# Patient Record
Sex: Male | Born: 1950 | Race: White | Hispanic: No | Marital: Married | Smoking: Never smoker
Health system: Southern US, Community
[De-identification: ages and names within clinical notes are randomized; demographics above are authoritative.]

## PROBLEM LIST (undated history)

## (undated) DIAGNOSIS — N200 Calculus of kidney: Secondary | ICD-10-CM

## (undated) DIAGNOSIS — I10 Essential (primary) hypertension: Secondary | ICD-10-CM

## (undated) DIAGNOSIS — E119 Type 2 diabetes mellitus without complications: Secondary | ICD-10-CM

## (undated) HISTORY — PX: MANDIBLE SURGERY: SHX707

## (undated) HISTORY — PX: SHOULDER SURGERY: SHX246

## (undated) HISTORY — DX: Calculus of kidney: N20.0

## (undated) HISTORY — PX: TOE AMPUTATION: SHX809

---

## 2013-01-07 ENCOUNTER — Emergency Department (HOSPITAL_COMMUNITY)
Admission: EM | Admit: 2013-01-07 | Discharge: 2013-01-07 | Disposition: A | Discharge status: Home / Self Care | Payer: No Typology Code available for payment source | Attending: Emergency Medicine | Admitting: Emergency Medicine

## 2013-01-07 ENCOUNTER — Encounter (HOSPITAL_COMMUNITY): Payer: Self-pay | Admitting: Emergency Medicine

## 2013-01-07 DIAGNOSIS — Z79899 Other long term (current) drug therapy: Secondary | ICD-10-CM | POA: Insufficient documentation

## 2013-01-07 DIAGNOSIS — R197 Diarrhea, unspecified: Secondary | ICD-10-CM | POA: Insufficient documentation

## 2013-01-07 DIAGNOSIS — A059 Bacterial foodborne intoxication, unspecified: Secondary | ICD-10-CM | POA: Insufficient documentation

## 2013-01-07 DIAGNOSIS — I10 Essential (primary) hypertension: Secondary | ICD-10-CM | POA: Insufficient documentation

## 2013-01-07 DIAGNOSIS — E119 Type 2 diabetes mellitus without complications: Secondary | ICD-10-CM | POA: Insufficient documentation

## 2013-01-07 HISTORY — DX: Essential (primary) hypertension: I10

## 2013-01-07 HISTORY — DX: Type 2 diabetes mellitus without complications: E11.9

## 2013-01-07 MED ORDER — ONDANSETRON HCL 4 MG/2ML IJ SOLN
4.0000 mg | Freq: Once | INTRAMUSCULAR | Status: AC
  Administered 2013-01-07: 4 mg via INTRAVENOUS
  Filled 2013-01-07: qty 2

## 2013-01-07 MED ORDER — SODIUM CHLORIDE 0.9 % IV BOLUS (SEPSIS)
1000.0000 mL | Freq: Once | INTRAVENOUS | Status: AC
  Administered 2013-01-07: 1000 mL via INTRAVENOUS

## 2013-01-07 MED ORDER — KETOROLAC TROMETHAMINE 30 MG/ML IJ SOLN
30.0000 mg | Freq: Once | INTRAMUSCULAR | Status: AC
  Administered 2013-01-07: 30 mg via INTRAVENOUS
  Filled 2013-01-07: qty 1

## 2013-01-07 NOTE — ED Provider Notes (Signed)
CSN: [id number]     Arrival date & time 01/07/13  1849 History   First MD Initiated Contact with Patient 01/07/13 1915     Chief Complaint  Patient presents with  . Emesis   (Consider location/radiation/quality/duration/timing/severity/associated sxs/prior Treatment) Patient is a 62 y.o. male presenting with vomiting. The history is provided by the patient. No language interpreter was used.  Emesis Duration:  5 hours Timing:  Intermittent How soon after eating does vomiting occur:  2 hours Associated symptoms: diarrhea   Associated symptoms: no abdominal pain   Risk factors: suspect food intake     Past Medical History  Diagnosis Date  . Diabetes mellitus without complication   . Hypertension    No past surgical history on file. No family history on file. History  Substance Use Topics  . Smoking status: Not on file  . Smokeless tobacco: Not on file  . Alcohol Use: Not on file    Review of Systems  Constitutional: Negative for activity change.  Gastrointestinal: Positive for nausea, vomiting and diarrhea. Negative for abdominal pain.  Pt is a 62 year old male who presents this evening with vomiting and nausea. He reports that he ate raw oysters on the half shell and steamed shrimp for lunch today at a [employer]. He reports that he had projectile vomiting 2 1/2 hours after eating and then diarrhea followed. He reports at least three episodes of diarrhea and vomiting episodes too numerous to count. No visible hemataemesis or bloody stools. He denies abdominal pain and reports that he just has an upset stomach. He also reports that he has gradually gotten a little better and that the vomiting is slowing down.   Allergies  Review of patient's allergies indicates no known allergies.  Home Medications   Current Outpatient Rx  Name  Route  Sig  Dispense  Refill  . aspirin-acetaminophen-caffeine (EXCEDRIN MIGRAINE) 250-250-65 MG per tablet   Oral   Take 1 tablet by  mouth every 6 (six) hours as needed for pain.         Marland Kitchen lisinopril (PRINIVIL,ZESTRIL) 20 MG tablet   Oral   Take 20 mg by mouth daily.         . metFORMIN (GLUCOPHAGE) 1000 MG tablet   Oral   Take 1,000 mg by mouth 2 (two) times daily with a meal.         . Multiple Vitamin (MULTIVITAMIN WITH MINERALS) TABS tablet   Oral   Take 1 tablet by mouth daily.          BP 104/68  Pulse 96  Temp(Src) 97.4 F (36.3 C) (Oral)  Resp 20  SpO2 95% Physical Exam  Nursing note and vitals reviewed. Constitutional: He is oriented to person, place, and time. He appears well-developed and well-nourished.  HENT:  Head: Atraumatic.  Mouth/Throat: Oropharynx is clear and moist.  Neck: Normal range of motion. Neck supple.  Cardiovascular: Normal rate, regular rhythm and intact distal pulses.  Exam reveals no gallop and no friction rub.   No murmur heard. Pulmonary/Chest: Effort normal and breath sounds normal.  Abdominal: Soft. Bowel sounds are normal. He exhibits no distension. There is no tenderness.  Musculoskeletal: Normal range of motion.  Neurological: He is alert and oriented to person, place, and time.  Skin: Skin is warm and dry.    ED Course  Procedures (including critical care time) Labs Review Labs Reviewed - No data to display Imaging Review No results found.  MDM   1.  Food poisoning, unspecified     PLAN Hydration with IV fluids and Zofran for nausea.     Irish Elders, NP 01/07/13 1957  Irish Elders, NP 01/07/13 2013

## 2013-01-07 NOTE — ED Notes (Signed)
Patient ate raw oysters today at lunch, started at 5pm with vomiting and diarrhea.  Denies abdominal pain.  EMS gave him 4mg  Zofran IV.  Patient is diabetic.  CBG-254 which he says is normal for him.  Denies fever, chills and no symptoms before today.

## 2013-01-07 NOTE — ED Notes (Signed)
Bed: WA09 Expected date:  Expected time:  Means of arrival:  Comments: EMS 

## 2013-01-07 NOTE — Progress Notes (Signed)
Patient confirms that he does not have a pcp.  As per patient's wife they have just moved here.  Patient's wife confirms that they have insurance but they do not know which type it is,  "But it's good."  EDCM instructed patient to call the number on the back of his insurance card or go to insurance company's website to help him find a physician whois close to him and within network.  Patient's wife verbalized understanding.  Patient being discharged home.

## 2013-01-08 LAB — GLUCOSE, CAPILLARY: Glucose-Capillary: 301 mg/dL — ABNORMAL HIGH (ref 70–99)

## 2013-01-12 NOTE — ED Provider Notes (Signed)
Medical screening examination/treatment/procedure(s) were performed by non-physician practitioner and as supervising physician I was immediately available for consultation/collaboration.  Juliet Rude. Rubin Payor, MD 01/12/13 [phone]

## 2013-03-01 ENCOUNTER — Encounter: Payer: Self-pay | Admitting: Family Medicine

## 2013-03-01 ENCOUNTER — Encounter (INDEPENDENT_AMBULATORY_CARE_PROVIDER_SITE_OTHER): Payer: Self-pay

## 2013-03-01 ENCOUNTER — Ambulatory Visit (INDEPENDENT_AMBULATORY_CARE_PROVIDER_SITE_OTHER): Payer: PRIVATE HEALTH INSURANCE | Admitting: Family Medicine

## 2013-03-01 VITALS — BP 117/74 | HR 75 | Temp 98.7°F | Ht 72.0 in | Wt 203.0 lb

## 2013-03-01 DIAGNOSIS — R634 Abnormal weight loss: Secondary | ICD-10-CM

## 2013-03-01 DIAGNOSIS — E119 Type 2 diabetes mellitus without complications: Secondary | ICD-10-CM

## 2013-03-01 DIAGNOSIS — I10 Essential (primary) hypertension: Secondary | ICD-10-CM

## 2013-03-01 DIAGNOSIS — R5381 Other malaise: Secondary | ICD-10-CM

## 2013-03-01 LAB — POCT CBC
Granulocyte percent: 52.9 %G (ref 37–80)
HCT, POC: 44.5 % (ref 43.5–53.7)
Hemoglobin: 15 g/dL (ref 14.1–18.1)
Lymph, poc: 3.4 (ref 0.6–3.4)
MCH, POC: 29.6 pg (ref 27–31.2)
MCHC: 33.6 g/dL (ref 31.8–35.4)
MCV: 88 fL (ref 80–97)
MPV: 6.3 fL (ref 0–99.8)
POC Granulocyte: 4.4 (ref 2–6.9)
POC LYMPH PERCENT: 41.5 %L (ref 10–50)
Platelet Count, POC: 348 10*3/uL (ref 142–424)
RBC: 5.1 M/uL (ref 4.69–6.13)
RDW, POC: 12.4 %
WBC: 8.3 10*3/uL (ref 4.6–10.2)

## 2013-03-01 LAB — POCT GLYCOSYLATED HEMOGLOBIN (HGB A1C): Hemoglobin A1C: 9.5

## 2013-03-01 MED ORDER — LISINOPRIL 20 MG PO TABS: 20.0000 mg | ORAL_TABLET | Freq: Every day | ORAL | Status: DC

## 2013-03-01 MED ORDER — METFORMIN HCL 1000 MG PO TABS: 1000.0000 mg | ORAL_TABLET | Freq: Two times a day (BID) | ORAL | Status: DC

## 2013-03-01 MED ORDER — DAPAGLIFLOZIN PROPANEDIOL 10 MG PO TABS: 10.0000 mg | ORAL_TABLET | ORAL | Status: DC

## 2013-03-01 NOTE — Patient Instructions (Signed)

## 2013-03-01 NOTE — Progress Notes (Signed)
  Subjective:    Patient ID: Joe Marks, male    DOB: [birth year], 62 y.o.   MRN: [id number]  HPI This 62 y.o. male presents for evaluation of diabetes.  He states he has been losing Weight and has polyuria.  He has hx of diabetes.  He is taking metformin 1000mg  po bid. He states he has been having fsbs in the 200's. He has been exercising and watching His diet.  He states he is a weight lifter and has prided himself in being in top notch shape By running and lifting weights.  He states he has lost 30 pounds despite eating and working Out.   Review of Systems C/o weight loss and hyperglycemia No chest pain, SOB, HA, dizziness, vision change, N/V, diarrhea, constipation, dysuria, urinary urgency or frequency, myalgias, arthralgias or rash.     Objective:   Physical Exam Vital signs noted  Well developed well nourished male.  HEENT - Head atraumatic Normocephalic                Eyes - PERRLA, Conjuctiva - clear Sclera- Clear EOMI                Ears - EAC's Wnl TM's Wnl Gross Hearing WNL                Nose - Nares patent                 Throat - oropharanx wnl Respiratory - Lungs CTA bilateral Cardiac - RRR S1 and S2 without murmur GI - Abdomen soft Nontender and bowel sounds active x 4 Extremities - No edema. Neuro - Grossly intact.  Results for orders placed in visit on 03/01/13  POCT CBC      Result Value Range   WBC 8.3  4.6 - 10.2 K/uL   Lymph, poc 3.4  0.6 - 3.4   POC LYMPH PERCENT 41.5  10 - 50 %L   POC Granulocyte 4.4  2 - 6.9   Granulocyte percent 52.9  37 - 80 %G   RBC 5.1  4.69 - 6.13 M/uL   Hemoglobin 15.0  14.1 - 18.1 g/dL   HCT, POC 30.8  65.7 - 53.7 %   MCV 88.0  80 - 97 fL   MCH, POC 29.6  27 - 31.2 pg   MCHC 33.6  31.8 - 35.4 g/dL   RDW, POC 84.6     Platelet Count, POC 348.0  142 - 424 K/uL   MPV 6.3  0 - 99.8 fL  POCT GLYCOSYLATED HEMOGLOBIN (HGB A1C)      Result Value Range   Hemoglobin A1C 9.5%         Assessment & Plan:  Other malaise  and fatigue - Plan: POCT CBC, POCT glycosylated hemoglobin (Hb A1C), CMP14+EGFR, Thyroid Panel With TSH  Diabetes - Plan: metFORMIN (GLUCOPHAGE) 1000 MG tablet, POCT CBC, POCT glycosylated hemoglobin (Hb A1C), CMP14+EGFR, Thyroid Panel With TSH, Dapagliflozin Propanediol (FARXIGA) 10 MG TABS.  Explained that the uncontrolled diabetes is causing him to lose body mass and that better control will help him.  Start farxiga 10mg  po qd #14 samples with a discount card.  Loss of weight - Plan: POCT CBC, POCT glycosylated hemoglobin (Hb A1C), CMP14+EGFR, Thyroid Panel With TSH  Essential hypertension, benign - Plan: lisinopril (PRINIVIL,ZESTRIL) 20 MG tablet  Deatra Canter FNP

## 2013-03-02 LAB — CMP14+EGFR
ALT: 13 IU/L (ref 0–44)
AST: 15 IU/L (ref 0–40)
Albumin/Globulin Ratio: 1.8 (ref 1.1–2.5)
Albumin: 4.2 g/dL (ref 3.6–4.8)
Alkaline Phosphatase: 84 IU/L (ref 39–117)
BUN/Creatinine Ratio: 16 (ref 10–22)
BUN: 13 mg/dL (ref 8–27)
CO2: 27 mmol/L (ref 18–29)
Calcium: 9.9 mg/dL (ref 8.6–10.2)
Chloride: 95 mmol/L — ABNORMAL LOW (ref 97–108)
Creatinine, Ser: 0.82 mg/dL (ref 0.76–1.27)
GFR calc Af Amer: 110 mL/min/{1.73_m2} (ref >59)
GFR calc non Af Amer: 95 mL/min/{1.73_m2} (ref >59)
Globulin, Total: 2.4 g/dL (ref 1.5–4.5)
Glucose: 241 mg/dL — ABNORMAL HIGH (ref 65–99)
Potassium: 4.7 mmol/L (ref 3.5–5.2)
Sodium: 138 mmol/L (ref 134–144)
Total Bilirubin: 1.3 mg/dL — ABNORMAL HIGH (ref 0.0–1.2)
Total Protein: 6.6 g/dL (ref 6.0–8.5)

## 2013-03-02 LAB — THYROID PANEL WITH TSH
Free Thyroxine Index: 2.2 (ref 1.2–4.9)
T3 Uptake Ratio: 30 % (ref 24–39)
T4, Total: 7.2 ug/dL (ref 4.5–12.0)
TSH: 1.05 u[IU]/mL (ref 0.450–4.500)

## 2013-03-11 ENCOUNTER — Other Ambulatory Visit: Payer: Self-pay | Admitting: Family Medicine

## 2013-03-11 ENCOUNTER — Telehealth: Payer: Self-pay | Admitting: *Deleted

## 2013-03-11 MED ORDER — CANAGLIFLOZIN 300 MG PO TABS: 300.0000 mg | ORAL_TABLET | Freq: Every day | ORAL | Status: DC

## 2013-03-11 NOTE — Telephone Encounter (Signed)
Rx for invokana sent to patient pharmacy

## 2013-03-11 NOTE — Telephone Encounter (Signed)
Ins co does not cover farxiga at all will not even let me fill out prior authorization,  However I got them to check invokana, komblize and onglyza and all three are covered without prior authorization,  Hope this is some help.thanks in advance for your attention.

## 2013-03-11 NOTE — Telephone Encounter (Signed)
Message left on pt phone rx had been changed and new rx sent to [facility] pharmacy and number provided

## 2013-03-11 NOTE — Telephone Encounter (Signed)
Sent invokana to pharmacy

## 2013-03-19 ENCOUNTER — Encounter: Payer: Self-pay | Admitting: Family Medicine

## 2013-03-19 ENCOUNTER — Ambulatory Visit (INDEPENDENT_AMBULATORY_CARE_PROVIDER_SITE_OTHER): Payer: PRIVATE HEALTH INSURANCE | Admitting: Family Medicine

## 2013-03-19 VITALS — BP 105/64 | HR 76 | Temp 99.4°F | Ht 73.0 in | Wt 202.0 lb

## 2013-03-19 DIAGNOSIS — E119 Type 2 diabetes mellitus without complications: Secondary | ICD-10-CM

## 2013-03-19 MED ORDER — GLIPIZIDE 10 MG PO TABS: 10.0000 mg | ORAL_TABLET | Freq: Two times a day (BID) | ORAL | Status: DC

## 2013-03-19 NOTE — Patient Instructions (Signed)

## 2013-03-19 NOTE — Progress Notes (Signed)
  Subjective:    Patient ID: Joe Marks, male    DOB: August 07, 1950, 62 y.o.   MRN: [id number]  HPI  This 62 y.o. male presents for evaluation of diabetes.  He is not tolerating metformin And his fsbs's are running in the 300's.  He used to take glucotrol and it worked.  Review of Systems No chest pain, SOB, HA, dizziness, vision change, N/V, diarrhea, constipation, dysuria, urinary urgency or frequency, myalgias, arthralgias or rash.     Objective:   Physical Exam Vital signs noted  Well developed well nourished male.  HEENT - Head atraumatic Normocephalic                Eyes - PERRLA, Conjuctiva - clear Sclera- Clear EOMI                Ears - EAC's Wnl TM's Wnl Gross Hearing WNL                Nose - Nares patent                 Throat - oropharanx wnl Respiratory - Lungs CTA bilateral Cardiac - RRR S1 and S2 without murmur GI - Abdomen soft Nontender and bowel sounds active x 4 Extremities - No edema. Neuro - Grossly intact.       Assessment & Plan:  Diabetes - Plan: glipiZIDE (GLUCOTROL) 10 MG tablet DC metformin, continue farxiga, and add glucotrol and if not better then  Follow up.  Followup in 3 months  Deatra Canter FNP

## 2013-04-12 ENCOUNTER — Other Ambulatory Visit: Payer: Self-pay | Admitting: Family Medicine

## 2013-04-13 MED ORDER — VARDENAFIL HCL 10 MG PO TABS: 10.0000 mg | ORAL_TABLET | Freq: Every day | ORAL | Status: DC | PRN

## 2013-06-02 ENCOUNTER — Ambulatory Visit: Payer: PRIVATE HEALTH INSURANCE | Admitting: Family Medicine

## 2013-06-11 ENCOUNTER — Ambulatory Visit: Payer: PRIVATE HEALTH INSURANCE | Admitting: Family Medicine

## 2013-06-14 ENCOUNTER — Ambulatory Visit: Payer: PRIVATE HEALTH INSURANCE | Admitting: Family Medicine

## 2013-07-02 ENCOUNTER — Ambulatory Visit: Payer: PRIVATE HEALTH INSURANCE | Admitting: Family Medicine

## 2013-07-07 ENCOUNTER — Ambulatory Visit (INDEPENDENT_AMBULATORY_CARE_PROVIDER_SITE_OTHER): Payer: BC Managed Care – PPO

## 2013-07-07 ENCOUNTER — Encounter: Payer: Self-pay | Admitting: Family Medicine

## 2013-07-07 ENCOUNTER — Ambulatory Visit (INDEPENDENT_AMBULATORY_CARE_PROVIDER_SITE_OTHER): Payer: BC Managed Care – PPO | Admitting: Family Medicine

## 2013-07-07 VITALS — BP 131/77 | HR 82 | Temp 97.4°F | Ht 72.0 in | Wt 212.2 lb

## 2013-07-07 DIAGNOSIS — M542 Cervicalgia: Secondary | ICD-10-CM

## 2013-07-07 DIAGNOSIS — M549 Dorsalgia, unspecified: Secondary | ICD-10-CM

## 2013-07-07 MED ORDER — HYDROCODONE-ACETAMINOPHEN 5-325 MG PO TABS: 1.0000 | ORAL_TABLET | Freq: Four times a day (QID) | ORAL | Status: DC | PRN
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
## 2013-07-07 NOTE — Addendum Note (Signed)
Addended by: Deatra CanterXFORD, WILLIAM J on: 07/07/2013 05:39 PM   Modules accepted: Orders

## 2013-07-09 ENCOUNTER — Telehealth: Payer: Self-pay | Admitting: Family Medicine

## 2013-07-16 ENCOUNTER — Ambulatory Visit: Payer: BC Managed Care – PPO | Attending: Family Medicine | Admitting: Physical Therapy

## 2013-07-16 DIAGNOSIS — IMO0001 Reserved for inherently not codable concepts without codable children: Secondary | ICD-10-CM | POA: Insufficient documentation

## 2013-07-16 DIAGNOSIS — M546 Pain in thoracic spine: Secondary | ICD-10-CM | POA: Insufficient documentation

## 2013-07-16 DIAGNOSIS — M542 Cervicalgia: Secondary | ICD-10-CM | POA: Insufficient documentation

## 2013-07-16 DIAGNOSIS — R5381 Other malaise: Secondary | ICD-10-CM | POA: Insufficient documentation

## 2013-07-20 ENCOUNTER — Ambulatory Visit: Payer: BC Managed Care – PPO | Admitting: Physical Therapy

## 2013-07-22 ENCOUNTER — Ambulatory Visit: Payer: BC Managed Care – PPO | Admitting: Physical Therapy

## 2013-07-23 ENCOUNTER — Ambulatory Visit: Payer: BC Managed Care – PPO | Admitting: Physical Therapy

## 2013-07-27 ENCOUNTER — Ambulatory Visit: Payer: BC Managed Care – PPO | Admitting: Physical Therapy

## 2013-07-29 ENCOUNTER — Ambulatory Visit: Payer: BC Managed Care – PPO | Attending: Family Medicine | Admitting: Physical Therapy

## 2013-07-29 DIAGNOSIS — M542 Cervicalgia: Secondary | ICD-10-CM | POA: Insufficient documentation

## 2013-07-29 DIAGNOSIS — IMO0001 Reserved for inherently not codable concepts without codable children: Secondary | ICD-10-CM | POA: Diagnosis present

## 2013-07-29 DIAGNOSIS — R5381 Other malaise: Secondary | ICD-10-CM | POA: Insufficient documentation

## 2013-07-29 DIAGNOSIS — M546 Pain in thoracic spine: Secondary | ICD-10-CM | POA: Insufficient documentation

## 2013-08-03 ENCOUNTER — Ambulatory Visit: Payer: BC Managed Care – PPO | Admitting: Physical Therapy

## 2013-08-03 DIAGNOSIS — IMO0001 Reserved for inherently not codable concepts without codable children: Secondary | ICD-10-CM | POA: Diagnosis not present

## 2013-08-05 ENCOUNTER — Ambulatory Visit: Payer: BC Managed Care – PPO | Admitting: Physical Therapy

## 2013-08-05 DIAGNOSIS — IMO0001 Reserved for inherently not codable concepts without codable children: Secondary | ICD-10-CM | POA: Diagnosis not present

## 2013-08-10 ENCOUNTER — Ambulatory Visit: Payer: BC Managed Care – PPO | Admitting: Physical Therapy

## 2013-08-10 DIAGNOSIS — IMO0001 Reserved for inherently not codable concepts without codable children: Secondary | ICD-10-CM | POA: Diagnosis not present

## 2013-08-12 ENCOUNTER — Ambulatory Visit: Payer: BC Managed Care – PPO | Admitting: Physical Therapy

## 2013-08-12 DIAGNOSIS — IMO0001 Reserved for inherently not codable concepts without codable children: Secondary | ICD-10-CM | POA: Diagnosis not present

## 2013-08-17 ENCOUNTER — Ambulatory Visit: Payer: BC Managed Care – PPO | Admitting: Physical Therapy

## 2013-08-17 DIAGNOSIS — IMO0001 Reserved for inherently not codable concepts without codable children: Secondary | ICD-10-CM | POA: Diagnosis not present

## 2013-08-19 ENCOUNTER — Ambulatory Visit: Payer: BC Managed Care – PPO | Admitting: Physical Therapy

## 2013-08-19 DIAGNOSIS — IMO0001 Reserved for inherently not codable concepts without codable children: Secondary | ICD-10-CM | POA: Diagnosis not present

## 2013-09-03 ENCOUNTER — Ambulatory Visit: Payer: PRIVATE HEALTH INSURANCE | Admitting: Family Medicine

## 2013-10-11 ENCOUNTER — Telehealth: Payer: Self-pay | Admitting: Family Medicine

## 2013-10-12 NOTE — Telephone Encounter (Signed)
Spoke with pt regarding pain med Pt notified he would need an office visit because he has not been seen since 06/2013 Pt verbalizes understanding and will call back to schedule

## 2013-10-25 ENCOUNTER — Other Ambulatory Visit: Payer: Self-pay | Admitting: Family Medicine

## 2013-10-25 ENCOUNTER — Telehealth: Payer: Self-pay | Admitting: Family Medicine

## 2013-10-25 DIAGNOSIS — E119 Type 2 diabetes mellitus without complications: Secondary | ICD-10-CM

## 2013-10-25 MED ORDER — METFORMIN HCL 1000 MG PO TABS: 1000.0000 mg | ORAL_TABLET | Freq: Two times a day (BID) | ORAL | Status: DC

## 2013-10-25 NOTE — Telephone Encounter (Signed)
Metformin sent to pharmacy

## 2013-10-25 NOTE — Telephone Encounter (Signed)
I do not see Metformin on med list?

## 2013-11-01 ENCOUNTER — Telehealth: Payer: Self-pay | Admitting: Family Medicine

## 2013-11-01 MED ORDER — GLIPIZIDE 10 MG PO TABS: 10.0000 mg | ORAL_TABLET | Freq: Two times a day (BID) | ORAL | Status: DC

## 2013-11-01 NOTE — Telephone Encounter (Signed)
done

## 2013-11-26 ENCOUNTER — Ambulatory Visit: Payer: PRIVATE HEALTH INSURANCE | Admitting: Family Medicine

## 2013-12-03 ENCOUNTER — Ambulatory Visit: Payer: PRIVATE HEALTH INSURANCE | Admitting: Family Medicine

## 2013-12-03 ENCOUNTER — Telehealth: Payer: Self-pay | Admitting: Family Medicine

## 2013-12-03 NOTE — Telephone Encounter (Signed)
appt scheduled

## 2013-12-09 ENCOUNTER — Ambulatory Visit (INDEPENDENT_AMBULATORY_CARE_PROVIDER_SITE_OTHER): Payer: BC Managed Care – PPO | Admitting: Family Medicine

## 2013-12-09 ENCOUNTER — Encounter: Payer: Self-pay | Admitting: Family Medicine

## 2013-12-09 VITALS — BP 124/79 | HR 84 | Temp 98.6°F | Ht 72.0 in | Wt 211.2 lb

## 2013-12-09 DIAGNOSIS — E1165 Type 2 diabetes mellitus with hyperglycemia: Principal | ICD-10-CM

## 2013-12-09 DIAGNOSIS — R5383 Other fatigue: Secondary | ICD-10-CM

## 2013-12-09 DIAGNOSIS — R5381 Other malaise: Secondary | ICD-10-CM

## 2013-12-09 DIAGNOSIS — IMO0001 Reserved for inherently not codable concepts without codable children: Secondary | ICD-10-CM

## 2013-12-09 DIAGNOSIS — K2100 Gastro-esophageal reflux disease with esophagitis, without bleeding: Secondary | ICD-10-CM

## 2013-12-09 DIAGNOSIS — E785 Hyperlipidemia, unspecified: Secondary | ICD-10-CM

## 2013-12-09 LAB — POCT CBC
Granulocyte percent: 55.8 %G (ref 37–80)
HCT, POC: 42 % — AB (ref 43.5–53.7)
Hemoglobin: 14.6 g/dL (ref 14.1–18.1)
Lymph, poc: 3.7 — AB (ref 0.6–3.4)
MCH, POC: 30.7 pg (ref 27–31.2)
MCHC: 34.8 g/dL (ref 31.8–35.4)
MCV: 88.2 fL (ref 80–97)
MPV: 6.8 fL (ref 0–99.8)
POC Granulocyte: 4.7 (ref 2–6.9)
POC LYMPH PERCENT: 43.1 %L (ref 10–50)
Platelet Count, POC: 298 10*3/uL (ref 142–424)
RBC: 4.8 M/uL (ref 4.69–6.13)
RDW, POC: 12.9 %
WBC: 8.5 10*3/uL (ref 4.6–10.2)

## 2013-12-09 LAB — GLUCOSE, POCT (MANUAL RESULT ENTRY): POC Glucose: 319 mg/dl — AB (ref 70–99)

## 2013-12-09 LAB — POCT GLYCOSYLATED HEMOGLOBIN (HGB A1C): Hemoglobin A1C: 10.4

## 2013-12-09 MED ORDER — ESOMEPRAZOLE MAGNESIUM 40 MG PO CPDR: 40.0000 mg | DELAYED_RELEASE_CAPSULE | Freq: Two times a day (BID) | ORAL | Status: DC

## 2013-12-09 NOTE — Progress Notes (Signed)
   Subjective:    Patient ID: Mohsin Crum, male    DOB: Jun 29, 1950, 63 y.o.   MRN: [id number]  HPI This 63 y.o. male presents for evaluation of follow up on diabetes.  He has been having GERD and swallowing problems.   Review of Systems No chest pain, SOB, HA, dizziness, vision change, N/V, diarrhea, constipation, dysuria, urinary urgency or frequency, myalgias, arthralgias or rash.     Objective:   Physical Exam  Vital signs noted  Well developed well nourished male.  HEENT - Head atraumatic Normocephalic                Eyes - PERRLA, Conjuctiva - clear Sclera- Clear EOMI                Ears - EAC's Wnl TM's Wnl Gross Hearing WNL                Nose - Nares patent                 Throat - oropharanx wnl Respiratory - Lungs CTA bilateral Cardiac - RRR S1 and S2 without murmur GI - Abdomen soft Nontender and bowel sounds active x 4 Extremities - No edema. Neuro - Grossly intact.      Assessment & Plan:  Diabetes mellitus type 2, uncontrolled, without complications - Plan: POCT glycosylated hemoglobin (Hb A1C), CMP14+EGFR  Hyperlipemia - Plan: CMP14+EGFR, Lipid panel  Other malaise and fatigue - Plan: POCT CBC  Gastroesophageal reflux disease with esophagitis - Plan: esomeprazole (NEXIUM) 40 MG capsule  Follow up in 3 months  Lysbeth Penner FNP

## 2013-12-09 NOTE — Addendum Note (Signed)
Addended by: Orma RenderHODGES, Shandel Busic F on: 12/09/2013 05:20 PM   Modules accepted: Orders

## 2013-12-10 LAB — CMP14+EGFR
ALT: 17 IU/L (ref 0–44)
AST: 15 IU/L (ref 0–40)
Albumin/Globulin Ratio: 1.6 (ref 1.1–2.5)
Albumin: 3.9 g/dL (ref 3.6–4.8)
Alkaline Phosphatase: 92 IU/L (ref 39–117)
BUN/Creatinine Ratio: 12 (ref 10–22)
BUN: 12 mg/dL (ref 8–27)
CO2: 22 mmol/L (ref 18–29)
Calcium: 9.6 mg/dL (ref 8.6–10.2)
Chloride: 93 mmol/L — ABNORMAL LOW (ref 97–108)
Creatinine, Ser: 0.97 mg/dL (ref 0.76–1.27)
GFR calc Af Amer: 96 mL/min/{1.73_m2} (ref >59)
GFR calc non Af Amer: 83 mL/min/{1.73_m2} (ref >59)
Globulin, Total: 2.4 g/dL (ref 1.5–4.5)
Glucose: 410 mg/dL (ref 65–99)
Potassium: 4.3 mmol/L (ref 3.5–5.2)
Sodium: 135 mmol/L (ref 134–144)
Total Bilirubin: 1.1 mg/dL (ref 0.0–1.2)
Total Protein: 6.3 g/dL (ref 6.0–8.5)

## 2013-12-10 LAB — LIPID PANEL
Chol/HDL Ratio: 6.4 ratio units — ABNORMAL HIGH (ref 0.0–5.0)
Cholesterol, Total: 178 mg/dL (ref 100–199)
HDL: 28 mg/dL — ABNORMAL LOW (ref >39)
Triglycerides: 703 mg/dL (ref 0–149)

## 2013-12-13 ENCOUNTER — Other Ambulatory Visit: Payer: Self-pay | Admitting: Family

## 2013-12-13 MED ORDER — METFORMIN HCL 500 MG PO TABS: 500.0000 mg | ORAL_TABLET | Freq: Two times a day (BID) | ORAL | Status: DC

## 2013-12-13 MED ORDER — ATORVASTATIN CALCIUM 80 MG PO TABS: 80.0000 mg | ORAL_TABLET | Freq: Every day | ORAL | Status: DC

## 2013-12-14 ENCOUNTER — Telehealth: Payer: Self-pay | Admitting: Family Medicine

## 2013-12-14 ENCOUNTER — Telehealth: Payer: Self-pay | Admitting: *Deleted

## 2013-12-14 ENCOUNTER — Other Ambulatory Visit: Payer: Self-pay | Admitting: Family Medicine

## 2013-12-14 NOTE — Telephone Encounter (Signed)
Message copied by Bearl MulberryUTHERFORD, Shakesha Soltau K on Tue Dec 14, 2013 11:54 AM ------      Message from: Lendon ColonelHAWKS, [place] A      Created: Mon Dec 13, 2013  4:33 PM       CBC (WBC, Hgb, and plts) WNL      HgbA1C and blood glucose abnormal- New rx sent to pharmacy- Pt needs appointment with Tammy for uncontrolled diabetes and cholesterol, pt needs to be on low carb diet      Kidney and liver function stable      Triglycerides high!!! Need to be on low fat diet- RX sent to pharmacy       ------

## 2013-12-15 MED ORDER — PANTOPRAZOLE SODIUM 40 MG PO TBEC: 40.0000 mg | DELAYED_RELEASE_TABLET | Freq: Every day | ORAL | Status: DC

## 2013-12-15 NOTE — Telephone Encounter (Signed)
protonix rx sent to pharmacy 

## 2013-12-16 ENCOUNTER — Encounter: Payer: Self-pay | Admitting: Family Medicine

## 2013-12-16 NOTE — Telephone Encounter (Signed)
Pt notified of results Verbalizes understanding Also informed that RX for Protonix was sent into the pharmacy as requested

## 2013-12-27 ENCOUNTER — Ambulatory Visit: Payer: BC Managed Care – PPO

## 2014-06-07 ENCOUNTER — Ambulatory Visit: Payer: Self-pay | Admitting: Family Medicine

## 2014-06-14 ENCOUNTER — Ambulatory Visit: Payer: Self-pay | Admitting: Family Medicine

## 2014-06-15 ENCOUNTER — Encounter (INDEPENDENT_AMBULATORY_CARE_PROVIDER_SITE_OTHER): Payer: Self-pay

## 2014-06-15 ENCOUNTER — Ambulatory Visit (INDEPENDENT_AMBULATORY_CARE_PROVIDER_SITE_OTHER): Payer: 59 | Admitting: Family Medicine

## 2014-06-15 ENCOUNTER — Encounter: Payer: Self-pay | Admitting: Family Medicine

## 2014-06-15 VITALS — BP 130/71 | HR 81 | Temp 96.9°F | Ht 72.0 in | Wt 207.2 lb

## 2014-06-15 DIAGNOSIS — L03031 Cellulitis of right toe: Secondary | ICD-10-CM | POA: Diagnosis not present

## 2014-06-15 DIAGNOSIS — IMO0002 Reserved for concepts with insufficient information to code with codable children: Secondary | ICD-10-CM

## 2014-06-15 DIAGNOSIS — E785 Hyperlipidemia, unspecified: Secondary | ICD-10-CM | POA: Diagnosis not present

## 2014-06-15 DIAGNOSIS — E1165 Type 2 diabetes mellitus with hyperglycemia: Secondary | ICD-10-CM | POA: Diagnosis not present

## 2014-06-15 DIAGNOSIS — L03116 Cellulitis of left lower limb: Secondary | ICD-10-CM

## 2014-06-15 DIAGNOSIS — R5383 Other fatigue: Secondary | ICD-10-CM

## 2014-06-15 LAB — POCT GLYCOSYLATED HEMOGLOBIN (HGB A1C): HEMOGLOBIN A1C: 10.1

## 2014-06-15 LAB — GLUCOSE, POCT (MANUAL RESULT ENTRY): POC GLUCOSE: 353 mg/dL — AB (ref 70–99)

## 2014-06-15 MED ORDER — ATORVASTATIN CALCIUM 80 MG PO TABS: 80.0000 mg | ORAL_TABLET | Freq: Every day | ORAL | Status: DC

## 2014-06-15 MED ORDER — SITAGLIP PHOS-METFORMIN HCL ER 100-1000 MG PO TB24: 1.0000 | ORAL_TABLET | Freq: Every day | ORAL | Status: DC

## 2014-06-15 NOTE — Patient Instructions (Signed)
Do not ibuprofen within 24 hours of having taken meloxicam. You can supplement the meloxicam with tramadol however monitor for drowsiness. Continue the clindamycin as it is. However, if she started noticing significant diarrhea then he will need to discontinue the clindamycin and let me know immediately if    Start taking the Janumet X are right away instead of the glipizide. You need to check her blood sugar twice a day every day the first time in the morning before eating or drinking anything other than water. The next 2 hours after your biggest meal of the day. Record those on the log sheet that you were given and bring that back with you when you come to your next appointment  We will toned muscles burn far more glucose than active flabby muscles. Therefore regular exercise that includes resistance training including moderate amount of weight lifting is vital to controlling you  Diabetes and Foot Care Diabetes may cause you to have problems because of poor blood supply (circulation) to your feet and legs. This may cause the skin on your feet to become thinner, break easier, and heal more slowly. Your skin may become dry, and the skin may peel and crack. You may also have nerve damage in your legs and feet causing decreased feeling in them. You may not notice minor injuries to your feet that could lead to infections or more serious problems. Taking care of your feet is one of the most important things you can do for yourself.  HOME CARE INSTRUCTIONS  Wear shoes at all times, even in the house. Do not go barefoot. Bare feet are easily injured.  Check your feet daily for blisters, cuts, and redness. If you cannot see the bottom of your feet, use a mirror or ask someone for help.  Wash your feet with warm water (do not use hot water) and mild soap. Then pat your feet and the areas between your toes until they are completely dry. Do not soak your feet as this can dry your skin.  Apply a moisturizing  lotion or petroleum jelly (that does not contain alcohol and is unscented) to the skin on your feet and to dry, brittle toenails. Do not apply lotion between your toes.  Trim your toenails straight across. Do not dig under them or around the cuticle. File the edges of your nails with an emery board or nail file.  Do not cut corns or calluses or try to remove them with medicine.  Wear clean socks or stockings every day. Make sure they are not too tight. Do not wear knee-high stockings since they may decrease blood flow to your legs.  Wear shoes that fit properly and have enough cushioning. To break in new shoes, wear them for just a few hours a day. This prevents you from injuring your feet. Always look in your shoes before you put them on to be sure there are no objects inside.  Do not cross your legs. This may decrease the blood flow to your feet.  If you find a minor scrape, cut, or break in the skin on your feet, keep it and the skin around it clean and dry. These areas may be cleansed with mild soap and water. Do not cleanse the area with peroxide, alcohol, or iodine.  When you remove an adhesive bandage, be sure not to damage the skin around it.  If you have a wound, look at it several times a day to make sure it is healing.  Do not use heating pads or hot water bottles. They may burn your skin. If you have lost feeling in your feet or legs, you may not know it is happening until it is too late.  Make sure your health care provider performs a complete foot exam at least annually or more often if you have foot problems. Report any cuts, sores, or bruises to your health care provider immediately. SEEK MEDICAL CARE IF:   You have an injury that is not healing.  You have cuts or breaks in the skin.  You have an ingrown nail.  You notice redness on your legs or feet.  You feel burning or tingling in your legs or feet.  You have pain or cramps in your legs and feet.  Your legs or  feet are numb.  Your feet always feel cold. SEEK IMMEDIATE MEDICAL CARE IF:   There is increasing redness, swelling, or pain in or around a wound.  There is a red line that goes up your leg.  Pus is coming from a wound.  You develop a fever or as directed by your health care provider.  You notice a bad smell coming from an ulcer or wound. Document Released: 04/12/2000 Document Revised: 12/16/2012 Document Reviewed: 09/22/2012 [facility] Patient Information 2015 [place], [place]. This information is not intended to replace advice given to you by your health care provider. Make sure you discuss any questions you have with your health care provider. Basic Carbohydrate Counting for Diabetes Mellitus Carbohydrate counting is a method for keeping track of the amount of carbohydrates you eat. Eating carbohydrates naturally increases the level of sugar (glucose) in your blood, so it is important for you to know the amount that is okay for you to have in every meal. Carbohydrate counting helps keep the level of glucose in your blood within normal limits. The amount of carbohydrates allowed is different for every person. A dietitian can help you calculate the amount that is right for you. Once you know the amount of carbohydrates you can have, you can count the carbohydrates in the foods you want to eat. Carbohydrates are found in the following foods:  Grains, such as breads and cereals.  Dried beans and soy products.  Starchy vegetables, such as potatoes, peas, and corn.  Fruit and fruit juices.  Milk and yogurt.  Sweets and snack foods, such as cake, cookies, candy, chips, soft drinks, and fruit drinks. CARBOHYDRATE COUNTING There are two ways to count the carbohydrates in your food. You can use either of the methods or a combination of both. Reading the "Nutrition Facts" on Packaged Food The "Nutrition Facts" is an area that is included on the labels of almost all packaged food and beverages  in the [place]. It includes the serving size of that food or beverage and information about the nutrients in each serving of the food, including the grams (g) of carbohydrate per serving.  Decide the number of servings of this food or beverage that you will be able to eat or drink. Multiply that number of servings by the number of grams of carbohydrate that is listed on the label for that serving. The total will be the amount of carbohydrates you will be having when you eat or drink this food or beverage. Learning Standard Serving Sizes of Food When you eat food that is not packaged or does not include "Nutrition Facts" on the label, you need to measure the servings in order to count the amount of  carbohydrates.A serving of most carbohydrate-rich foods contains about 15 g of carbohydrates. The following list includes serving sizes of carbohydrate-rich foods that provide 15 g ofcarbohydrate per serving:   1 slice of bread (1 oz) or 1 six-inch tortilla.    of a hamburger bun or English muffin.  4-6 crackers.   cup unsweetened dry cereal.    cup hot cereal.   cup rice or pasta.    cup mashed potatoes or  of a large baked potato.  1 cup fresh fruit or one small piece of fruit.    cup canned or frozen fruit or fruit juice.  1 cup milk.   cup plain fat-free yogurt or yogurt sweetened with artificial sweeteners.   cup cooked dried beans or starchy vegetable, such as peas, corn, or potatoes.  Decide the number of standard-size servings that you will eat. Multiply that number of servings by 15 (the grams of carbohydrates in that serving). For example, if you eat 2 cups of strawberries, you will have eaten 2 servings and 30 g of carbohydrates (2 servings x 15 g = 30 g). For foods such as soups and casseroles, in which more than one food is mixed in, you will need to count the carbohydrates in each food that is included. EXAMPLE OF CARBOHYDRATE COUNTING Sample Dinner  3 oz  chicken breast.   cup of brown rice.   cup of corn.  1 cup milk.   1 cup strawberries with sugar-free whipped topping.  Carbohydrate Calculation Step 1: Identify the foods that contain carbohydrates:   Rice.   Corn.   Milk.   Strawberries. Step 2:Calculate the number of servings eaten of each:   2 servings of rice.   1 serving of corn.   1 serving of milk.   1 serving of strawberries. Step 3: Multiply each of those number of servings by 15 g:   2 servings of rice x 15 g = 30 g.   1 serving of corn x 15 g = 15 g.   1 serving of milk x 15 g = 15 g.   1 serving of strawberries x 15 g = 15 g. Step 4: Add together all of the amounts to find the total grams of carbohydrates eaten: 30 g + 15 g + 15 g + 15 g = 75 g. Document Released: 04/15/2005 Document Revised: 08/30/2013 Document Reviewed: 03/12/2013 [facility] Patient Information 2015 [place], [place]. This information is not intended to replace advice given to you by your health care provider. Make sure you discuss any questions you have with your health care provider. Diabetes and Exercise Exercising regularly is important. It is not just about losing weight. It has many health benefits, such as:  Improving your overall fitness, flexibility, and endurance.  Increasing your bone density.  Helping with weight control.  Decreasing your body fat.  Increasing your muscle strength.  Reducing stress and tension.  Improving your overall health. People with diabetes who exercise gain additional benefits because exercise:  Reduces appetite.  Improves the body's use of blood sugar (glucose).  Helps lower or control blood glucose.  Decreases blood pressure.  Helps control blood lipids (such as cholesterol and triglycerides).  Improves the body's use of the hormone insulin by:  Increasing the body's insulin sensitivity.  Reducing the body's insulin needs.  Decreases the risk for heart disease  because exercising:  Lowers cholesterol and triglycerides levels.  Increases the levels of good cholesterol (such as high-density lipoproteins [HDL]) in the body.  Lowers blood glucose levels. YOUR ACTIVITY PLAN  Choose an activity that you enjoy and set realistic goals. Your health care provider or diabetes educator can help you make an activity plan that works for you. Exercise regularly as directed by your health care provider. This includes:  Performing resistance training twice a week such as push-ups, sit-ups, lifting weights, or using resistance bands.  Performing 150 minutes of cardio exercises each week such as walking, running, or playing sports.  Staying active and spending no more than 90 minutes at one time being inactive. Even short bursts of exercise are good for you. Three 10-minute sessions spread throughout the day are just as beneficial as a single 30-minute session. Some exercise ideas include:  Taking the dog for a walk.  Taking the stairs instead of the elevator.  Dancing to your favorite song.  Doing an exercise video.  Doing your favorite exercise with a friend. RECOMMENDATIONS FOR EXERCISING WITH TYPE 1 OR TYPE 2 DIABETES   Check your blood glucose before exercising. If blood glucose levels are greater than 240 mg/dL, check for urine ketones. Do not exercise if ketones are present.  Avoid injecting insulin into areas of the body that are going to be exercised. For example, avoid injecting insulin into:  The arms when playing tennis.  The legs when jogging.  Keep a record of:  Food intake before and after you exercise.  Expected peak times of insulin action.  Blood glucose levels before and after you exercise.  The type and amount of exercise you have done.  Review your records with your health care provider. Your health care provider will help you to develop guidelines for adjusting food intake and insulin amounts before and after  exercising.  If you take insulin or oral hypoglycemic agents, watch for signs and symptoms of hypoglycemia. They include:  Dizziness.  Shaking.  Sweating.  Chills.  Confusion.  Drink plenty of water while you exercise to prevent dehydration or heat stroke. Body water is lost during exercise and must be replaced.  Talk to your health care provider before starting an exercise program to make sure it is safe for you. Remember, almost any type of activity is better than none. Document Released: 07/06/2003 Document Revised: 08/30/2013 Document Reviewed: 09/22/2012 [facility] Patient Information 2015 [place]. This information is not intended to replace advice given to you by your health care provider. Make sure you discuss any questions you have with your health care provider. r blood sugar.

## 2014-06-15 NOTE — Progress Notes (Signed)
 Subjective:  Patient ID: Joe Marks, male    DOB: 06/21/1950  Age: 63 y.o. MRN: [id number]  CC: Diabetes; Hyperlipidemia; Hypertension; and Cellulitis   HPI Joe Marks presents for diabetes. Patient does not check blood sugar at home. He had gotten off track due to various circumstances and quit taking his medications. He developed an pain in the knee and was recently diagnosed with cellulitis. He was placed on doxycycline for that a few days ago. The pain in the knee continues to grow. Patient denies symptoms such as polyuria, polydipsia, excessive hunger, nausea No significant hypoglycemic spells noted. Medications as noted below. Taking them regularly without complication/adverse reaction being reported today.   Patient in for follow-up of elevated cholesterol. Doing well without complaints on current medication. He ran out of it several weeks ago Denies side effects of statin including myalgia and arthralgia and nausea. Also in today for liver function testing. Currently no chest pain, shortness of breath or other cardiovascular related symptoms noted.  Patient in for follow-up of hypertension. Patient has no history of headache chest pain or shortness of breath or recent cough. Patient also denies symptoms of TIA such as numbness weakness lateralizing. Patient checks  blood pressure at home and has not had any elevated readings recently. Patient denies side effects from his medication. States taking it regularly.   History Joe Marks has a past medical history of Diabetes mellitus without complication; Hypertension; and Kidney stones.   He has past surgical history that includes Shoulder surgery (Right) and Mandible surgery.   His family history includes COPD in his father; Cancer in his brother, brother, and father.He reports that he has never smoked. He has never used smokeless tobacco. He reports that he drinks alcohol. He reports that he does not use illicit drugs.  Current  Outpatient Prescriptions on File Prior to Visit  Medication Sig Dispense Refill  . aspirin-acetaminophen-caffeine (EXCEDRIN MIGRAINE) 250-250-65 MG per tablet Take 1 tablet by mouth every 6 (six) hours as needed for pain.    . lisinopril (PRINIVIL,ZESTRIL) 20 MG tablet Take 1 tablet (20 mg total) by mouth daily. 90 tablet 3  . pantoprazole (PROTONIX) 40 MG tablet Take 1 tablet (40 mg total) by mouth daily. 30 tablet 3  . vardenafil (LEVITRA) 10 MG tablet Take 1 tablet (10 mg total) by mouth daily as needed for erectile dysfunction. 10 tablet 11   No current facility-administered medications on file prior to visit.    ROS Review of Systems  Constitutional: Negative for fever, chills, diaphoresis and unexpected weight change.  HENT: Negative for congestion, hearing loss, rhinorrhea, sore throat and trouble swallowing.   Respiratory: Negative for cough, chest tightness, shortness of breath and wheezing.   Gastrointestinal: Negative for nausea, vomiting, abdominal pain, diarrhea, constipation and abdominal distention.  Endocrine: Negative for cold intolerance and heat intolerance.  Genitourinary: Negative for dysuria, hematuria and flank pain.  Musculoskeletal: Negative for joint swelling and arthralgias.  Skin: Negative for rash.  Neurological: Negative for dizziness and headaches.  Psychiatric/Behavioral: Negative for dysphoric mood, decreased concentration and agitation. The patient is not nervous/anxious.     Objective:  BP 130/71 mmHg  Pulse 81  Temp(Src) 96.9 F (36.1 C) (Oral)  Ht 6' (1.829 m)  Wt 207 lb 3.2 oz (93.985 kg)  BMI 28.10 kg/m2  BP Readings from Last 3 Encounters:  06/18/14 143/89  06/15/14 130/71  12/09/13 124/79    Wt Readings from Last 3 Encounters:  06/18/14 207 lb 6.4 oz (94.076 kg)    06/15/14 207 lb 3.2 oz (93.985 kg)  12/09/13 211 lb 3.2 oz (95.8 kg)     Physical Exam  Constitutional: He is oriented to person, place, and time. He appears  well-developed and well-nourished. No distress.  HENT:  Head: Normocephalic and atraumatic.  Right Ear: External ear normal.  Left Ear: External ear normal.  Nose: Nose normal.  Mouth/Throat: Oropharynx is clear and moist.  Eyes: Conjunctivae and EOM are normal. Pupils are equal, round, and reactive to light.  Neck: Normal range of motion. Neck supple. No thyromegaly present.  Cardiovascular: Normal rate, regular rhythm and normal heart sounds.   No murmur heard. Pulmonary/Chest: Effort normal and breath sounds normal. No respiratory distress. He has no wheezes. He has no rales.  Abdominal: Soft. Bowel sounds are normal. He exhibits no distension. There is no tenderness.  Musculoskeletal: He exhibits edema (2+ at left knee) and tenderness (range of motion tender for the left knee with diminished flexion and extension.).  Lymphadenopathy:    He has no cervical adenopathy.  Neurological: He is alert and oriented to person, place, and time. He has normal reflexes.  Skin: Skin is warm and dry. There is erythema (approximately 8 cm of erythema just to the lateral left of the knee. It is slightly inferior as well.).  The right great toe has a 4 mm area of erythema at the base of the nail with fluctuance but mild edema.  Psychiatric: He has a normal mood and affect. His behavior is normal. Judgment and thought content normal.    Lab Results  Component Value Date   HGBA1C 10.1 06/15/2014   HGBA1C 10.4% 12/09/2013   HGBA1C 9.5% 03/01/2013    Lab Results  Component Value Date   WBC 8.5 12/09/2013   HGB 14.6 12/09/2013   HCT 42.0* 12/09/2013   GLUCOSE 325* 06/15/2014   TRIG 167* 06/15/2014   HDL 40 06/15/2014   LDLCALC 97 06/15/2014   ALT 14 06/15/2014   AST 17 06/15/2014   NA 136 06/15/2014   K 4.4 06/15/2014   CL 96* 06/15/2014   CREATININE 1.03 06/15/2014   BUN 18 06/15/2014   CO2 25 06/15/2014   TSH 1.050 03/01/2013   HGBA1C 10.1 06/15/2014    No results  found.  Assessment & Plan:   Joe Marks was seen today for diabetes, hyperlipidemia, hypertension and cellulitis.  Diagnoses and all orders for this visit:  Diabetes mellitus type 2, uncontrolled Orders: -     POCT glycosylated hemoglobin (Hb A1C) -     CMP14+EGFR -     POCT UA - Microalbumin -     POCT glucose (manual entry)  Hyperlipemia Orders: -     CMP14+EGFR -     Lipid panel -     POCT glucose (manual entry)  Other fatigue Orders: -     CBC with Differential/Platelet -     POCT glucose (manual entry)  Cellulitis of left lower extremity  Paronychia of great toe of right foot  Other orders -     SitaGLIPtin-MetFORMIN HCl (JANUMET XR) [phone] MG TB24; Take 1 tablet by mouth daily. -     atorvastatin (LIPITOR) 80 MG tablet; Take 1 tablet (80 mg total) by mouth daily.   I have discontinued Mr. Jasperson multivitamin with minerals, metFORMIN, and glipiZIDE. I am also having him start on SitaGLIPtin-MetFORMIN HCl. Additionally, I am having him maintain his aspirin-acetaminophen-caffeine, lisinopril, vardenafil, pantoprazole, clindamycin, meloxicam, and atorvastatin.  Meds ordered this encounter  Medications  . clindamycin (  CLEOCIN) 300 MG capsule    Sig: Take 300 mg by mouth 4 (four) times daily.  . meloxicam (MOBIC) 15 MG tablet    Sig: Take 15 mg by mouth daily.  . SitaGLIPtin-MetFORMIN HCl (JANUMET XR) 100-1000 MG TB24    Sig: Take 1 tablet by mouth daily.    Dispense:  30 tablet    Refill:  2  . atorvastatin (LIPITOR) 80 MG tablet    Sig: Take 1 tablet (80 mg total) by mouth daily.    Dispense:  90 tablet    Refill:  3    Follow-up: No Follow-up on file.  Warren Stacks, M.D.  

## 2014-06-16 ENCOUNTER — Telehealth: Payer: Self-pay | Admitting: Family Medicine

## 2014-06-16 LAB — CMP14+EGFR
A/G RATIO: 1.3 (ref 1.1–2.5)
ALBUMIN: 3.8 g/dL (ref 3.6–4.8)
ALT: 14 IU/L (ref 0–44)
AST: 17 IU/L (ref 0–40)
Alkaline Phosphatase: 113 IU/L (ref 39–117)
BILIRUBIN TOTAL: 1.3 mg/dL — AB (ref 0.0–1.2)
BUN/Creatinine Ratio: 17 (ref 10–22)
BUN: 18 mg/dL (ref 8–27)
CO2: 25 mmol/L (ref 18–29)
Calcium: 9 mg/dL (ref 8.6–10.2)
Chloride: 96 mmol/L — ABNORMAL LOW (ref 97–108)
Creatinine, Ser: 1.03 mg/dL (ref 0.76–1.27)
GFR, EST AFRICAN AMERICAN: 89 mL/min/{1.73_m2} (ref >59)
GFR, EST NON AFRICAN AMERICAN: 77 mL/min/{1.73_m2} (ref >59)
GLUCOSE: 325 mg/dL — AB (ref 65–99)
Globulin, Total: 2.9 g/dL (ref 1.5–4.5)
Potassium: 4.4 mmol/L (ref 3.5–5.2)
Sodium: 136 mmol/L (ref 134–144)
TOTAL PROTEIN: 6.7 g/dL (ref 6.0–8.5)

## 2014-06-16 LAB — LIPID PANEL
CHOL/HDL RATIO: 4.3 ratio (ref 0.0–5.0)
Cholesterol, Total: 170 mg/dL (ref 100–199)
HDL: 40 mg/dL (ref >39)
LDL CALC: 97 mg/dL (ref 0–99)
Triglycerides: 167 mg/dL — ABNORMAL HIGH (ref 0–149)
VLDL CHOLESTEROL CAL: 33 mg/dL (ref 5–40)

## 2014-06-16 MED ORDER — TRAMADOL HCL 50 MG PO TABS: 50.0000 mg | ORAL_TABLET | Freq: Two times a day (BID) | ORAL | Status: DC | PRN

## 2014-06-16 NOTE — Telephone Encounter (Signed)
Tramadol was ordered for #8  Dr Darlyn ReadStacks / Joyce GrossKay please advise --- pt will need more med or different med from you on Friday.   Please route to [employer] or [facility] B when addressed.

## 2014-06-16 NOTE — Telephone Encounter (Signed)
Please give this patient all TRAM 50 mg 14 times daily if needed for severe pain #8 and have patient call Dr. Darlyn Readstacks tomorrow for more medicine if needed

## 2014-06-16 NOTE — Telephone Encounter (Signed)
Patient states that Dr. Darlyn ReadStacks was going to give him something for pain and was not given anything. Patients wife states that he is in severe pain and needs something today if possible. Patients wife states that he has not had any sleep and really needs something today

## 2014-06-17 MED ORDER — METFORMIN HCL ER 750 MG PO TB24: 750.0000 mg | ORAL_TABLET | Freq: Two times a day (BID) | ORAL | Status: DC

## 2014-06-17 NOTE — Telephone Encounter (Signed)
Secondary prescription for tramadol 1 or 2 every 4 hours as needed for moderate to severe pain #50 with 2 refills.

## 2014-06-17 NOTE — Telephone Encounter (Signed)
The medication in question is Janumet. See if Joe Marks can get that approved right away. Also see if there is a substitute that is on the formulary that we can use. Meanwhile put Joe Marks on metformin XR 750 mg 2 daily. One month supply

## 2014-06-17 NOTE — Telephone Encounter (Signed)
Pt notified of RX 

## 2014-06-17 NOTE — Telephone Encounter (Signed)
Patient wife aware and rx sent in.

## 2014-06-18 ENCOUNTER — Encounter: Payer: Self-pay | Admitting: Family Medicine

## 2014-06-18 ENCOUNTER — Ambulatory Visit (INDEPENDENT_AMBULATORY_CARE_PROVIDER_SITE_OTHER): Payer: 59 | Admitting: Family Medicine

## 2014-06-18 VITALS — BP 143/89 | HR 73 | Temp 96.8°F | Ht 72.0 in | Wt 207.4 lb

## 2014-06-18 DIAGNOSIS — L03116 Cellulitis of left lower limb: Secondary | ICD-10-CM | POA: Diagnosis not present

## 2014-06-18 DIAGNOSIS — E1165 Type 2 diabetes mellitus with hyperglycemia: Secondary | ICD-10-CM

## 2014-06-18 DIAGNOSIS — L03115 Cellulitis of right lower limb: Secondary | ICD-10-CM

## 2014-06-18 DIAGNOSIS — IMO0002 Reserved for concepts with insufficient information to code with codable children: Secondary | ICD-10-CM

## 2014-06-18 LAB — GLUCOSE, POCT (MANUAL RESULT ENTRY)

## 2014-06-18 MED ORDER — CEFTRIAXONE SODIUM 1 G IJ SOLR
1.0000 g | INTRAMUSCULAR | Status: AC
  Administered 2014-06-18: 1 g via INTRAMUSCULAR

## 2014-06-18 MED ORDER — HYDROCODONE-ACETAMINOPHEN 10-325 MG PO TABS: 1.0000 | ORAL_TABLET | ORAL | Status: DC | PRN

## 2014-06-18 MED ORDER — KETOROLAC TROMETHAMINE 60 MG/2ML IM SOLN
60.0000 mg | Freq: Once | INTRAMUSCULAR | Status: AC
  Administered 2014-06-18: 60 mg via INTRAMUSCULAR

## 2014-06-18 NOTE — Progress Notes (Addendum)
Subjective:  Patient ID: Joe Marks, male    DOB: 07/24/1950  Age: 64 y.o. MRN: [id number]  CC: rck on cellulitis; right great toe infected; and pain in right side   HPI Joe Marks presents for increasing pain at the left knee. He has to limp/hobble now. Pending gets so weak now that he can hardly get around. He has been unable to get the metformin. The Janumet requires a prior authorization and has not been approved yet. He agrees to pick up the metformin as soon as he leaves here today. He states the tramadol has been totally ineffective for relieving pain. The left knee pain is now 8/10 and constant. It is interfering with sleep. There has been no drainage or increased fluctuance of the wound according to the patient however the redness has significantly diminished but that has not led to pain decreasing.  The blood sugar is still running in the high 200s since he has not gotten the medicine but he is trying to watch his diet.  The right great toe is not painful now and the redness is diminished.  History Joe Marks has a past medical history of Diabetes mellitus without complication; Hypertension; and Kidney stones.   He has past surgical history that includes Shoulder surgery (Right) and Mandible surgery.   His family history includes COPD in his father; Cancer in his brother, brother, and father.He reports that he has never smoked. He has never used smokeless tobacco. He reports that he drinks alcohol. He reports that he does not use illicit drugs.  Current Outpatient Prescriptions on File Prior to Visit  Medication Sig Dispense Refill  . aspirin-acetaminophen-caffeine (EXCEDRIN MIGRAINE) 250-250-65 MG per tablet Take 1 tablet by mouth every 6 (six) hours as needed for pain.    Marland Kitchen. atorvastatin (LIPITOR) 80 MG tablet Take 1 tablet (80 mg total) by mouth daily. 90 tablet 3  . clindamycin (CLEOCIN) 300 MG capsule Take 300 mg by mouth 4 (four) times daily.    Marland Kitchen. lisinopril  (PRINIVIL,ZESTRIL) 20 MG tablet Take 1 tablet (20 mg total) by mouth daily. 90 tablet 3  . meloxicam (MOBIC) 15 MG tablet Take 15 mg by mouth daily.    . metFORMIN (GLUCOPHAGE XR) 750 MG 24 hr tablet Take 1 tablet (750 mg total) by mouth 2 (two) times daily. 60 tablet 0  . pantoprazole (PROTONIX) 40 MG tablet Take 1 tablet (40 mg total) by mouth daily. 30 tablet 3  . SitaGLIPtin-MetFORMIN HCl (JANUMET XR) [phone] MG TB24 Take 1 tablet by mouth daily. 30 tablet 2  . traMADol (ULTRAM) 50 MG tablet Take 1 tablet (50 mg total) by mouth 2 (two) times daily as needed. 8 tablet 0  . vardenafil (LEVITRA) 10 MG tablet Take 1 tablet (10 mg total) by mouth daily as needed for erectile dysfunction. 10 tablet 11   No current facility-administered medications on file prior to visit.    ROS Review of Systems  Constitutional: Negative for fever, chills, diaphoresis and unexpected weight change.  HENT: Negative for congestion, hearing loss, rhinorrhea, sore throat and trouble swallowing.   Respiratory: Negative for cough, chest tightness, shortness of breath and wheezing.   Gastrointestinal: Negative for nausea, vomiting, abdominal pain, diarrhea, constipation and abdominal distention.  Endocrine: Negative for cold intolerance and heat intolerance.  Genitourinary: Positive for flank pain.  Musculoskeletal: Positive for arthralgias (left knee painful, can not extend past -20 degrees. ). Negative for joint swelling.  Skin: Negative for rash.  The area of erythema at the left lateral tibial plateau region has dramatically decreased in erythema, however there is slight spongy edema without fluctuance over the bone and it is markedly tender to palpation and to range of motion at the knee.  Neurological: Negative for dizziness and headaches.  Psychiatric/Behavioral: Negative for dysphoric mood, decreased concentration and agitation. The patient is not nervous/anxious.     Objective:  BP 143/89 mmHg  Pulse  73  Temp(Src) 96.8 F (36 C) (Oral)  Ht 6' (1.829 m)  Wt 207 lb 6.4 oz (94.076 kg)  BMI 28.12 kg/m2  BP Readings from Last 3 Encounters:  06/18/14 143/89  06/15/14 130/71  12/09/13 124/79    Wt Readings from Last 3 Encounters:  06/18/14 207 lb 6.4 oz (94.076 kg)  06/15/14 207 lb 3.2 oz (93.985 kg)  12/09/13 211 lb 3.2 oz (95.8 kg)     Physical Exam  Abdominal: Guarding: the redness at the left knee has remitted significantly. It is less red and now about 5 cm in diameter.  Skin: There is erythema.    Lab Results  Component Value Date   HGBA1C 10.1 06/15/2014   HGBA1C 10.4% 12/09/2013   HGBA1C 9.5% 03/01/2013    Lab Results  Component Value Date   WBC 8.5 12/09/2013   HGB 14.6 12/09/2013   HCT 42.0* 12/09/2013   GLUCOSE 325* 06/15/2014   TRIG 167* 06/15/2014   HDL 40 06/15/2014   LDLCALC 97 06/15/2014   ALT 14 06/15/2014   AST 17 06/15/2014   NA 136 06/15/2014   K 4.4 06/15/2014   CL 96* 06/15/2014   CREATININE 1.03 06/15/2014   BUN 18 06/15/2014   CO2 25 06/15/2014   TSH 1.050 03/01/2013   HGBA1C 10.1 06/15/2014    No results found.  Assessment & Plan:   Joe Marks was seen today for rck on cellulitis, right great toe infected and pain in right side.  Diagnoses and all orders for this visit:  Diabetes mellitus type 2, uncontrolled Orders: -     POCT glucose (manual entry)  Cellulitis of left lower extremity Orders: -     Uric acid -     Cancel: CBC with Differential/Platelet -     cefTRIAXone (ROCEPHIN) injection 1 g; Inject 1 g into the muscle now. -     ketorolac (TORADOL) injection 60 mg; Inject 2 mLs (60 mg total) into the muscle once. -     [place] Misc Soft Tissue; Future -     CBC with Differential/Platelet  Cellulitis of right foot Orders: -     CBC with Differential/Platelet  Other orders -     HYDROcodone-acetaminophen (NORCO) 10-325 MG per tablet; Take 1 tablet by mouth every 4 (four) hours as needed for moderate pain.   I am having  Joe Marks start on HYDROcodone-acetaminophen. I am also having him maintain his aspirin-acetaminophen-caffeine, lisinopril, vardenafil, pantoprazole, clindamycin, meloxicam, SitaGLIPtin-MetFORMIN HCl, atorvastatin, traMADol, and metFORMIN. We administered cefTRIAXone and ketorolac.  Meds ordered this encounter  Medications  . cefTRIAXone (ROCEPHIN) injection 1 g    Sig:     Order Specific Question:  Antibiotic Indication:    Answer:  Cellulitis  . ketorolac (TORADOL) injection 60 mg    Sig:   . HYDROcodone-acetaminophen (NORCO) 10-325 MG per tablet    Sig: Take 1 tablet by mouth every 4 (four) hours as needed for moderate pain.    Dispense:  40 tablet    Refill:  0   Follow-up: Return in  about 3 days (around 06/21/2014) for cellulitis, diabetes.  Mechele Claude, M.D.

## 2014-06-18 NOTE — Patient Instructions (Signed)
If not better Monday AM come to office for on call provider. IF stable, Call Debbie with referrals to check on time for ultrasound. I will see you the first day beck from [employer].

## 2014-06-20 ENCOUNTER — Other Ambulatory Visit: Payer: Self-pay | Admitting: Family Medicine

## 2014-06-20 ENCOUNTER — Ambulatory Visit (HOSPITAL_COMMUNITY)
Admission: RE | Admit: 2014-06-20 | Discharge: 2014-06-20 | Disposition: A | Discharge status: Home / Self Care | Payer: 59 | Source: Ambulatory Visit | Attending: Family Medicine | Admitting: Family Medicine

## 2014-06-20 ENCOUNTER — Other Ambulatory Visit: Payer: Self-pay

## 2014-06-20 ENCOUNTER — Telehealth: Payer: Self-pay | Admitting: Family Medicine

## 2014-06-20 DIAGNOSIS — L03116 Cellulitis of left lower limb: Secondary | ICD-10-CM | POA: Insufficient documentation

## 2014-06-20 DIAGNOSIS — M25562 Pain in left knee: Secondary | ICD-10-CM

## 2014-06-20 LAB — CBC WITH DIFFERENTIAL/PLATELET
BASOS: 0 %
Basophils Absolute: 0 10*3/uL (ref 0.0–0.2)
Eos: 2 %
Eosinophils Absolute: 0.2 10*3/uL (ref 0.0–0.4)
HCT: 42.7 % (ref 37.5–51.0)
HEMOGLOBIN: 14.3 g/dL (ref 12.6–17.7)
IMMATURE GRANS (ABS): 0 10*3/uL (ref 0.0–0.1)
Immature Granulocytes: 0 %
LYMPHS: 22 %
Lymphocytes Absolute: 2 10*3/uL (ref 0.7–3.1)
MCH: 30.4 pg (ref 26.6–33.0)
MCHC: 33.5 g/dL (ref 31.5–35.7)
MCV: 91 fL (ref 79–97)
Monocytes Absolute: 0.8 10*3/uL (ref 0.1–0.9)
Monocytes: 9 %
NEUTROS ABS: 5.7 10*3/uL (ref 1.4–7.0)
Neutrophils Relative %: 67 %
Platelets: 345 10*3/uL (ref 150–379)
RBC: 4.7 x10E6/uL (ref 4.14–5.80)
RDW: 12.7 % (ref 12.3–15.4)
WBC: 8.7 10*3/uL (ref 3.4–10.8)

## 2014-06-20 LAB — SPECIMEN STATUS REPORT

## 2014-06-20 LAB — URIC ACID: Uric Acid: 5 mg/dL (ref 3.7–8.6)

## 2014-06-20 NOTE — Telephone Encounter (Signed)
Pt notified that Sr Stacks has not reviewed results but we will call with results when reviewed

## 2014-06-21 ENCOUNTER — Telehealth: Payer: Self-pay

## 2014-06-21 NOTE — Telephone Encounter (Signed)
Patient calling about MRI?   No MRI in workqueue

## 2014-06-22 ENCOUNTER — Ambulatory Visit (INDEPENDENT_AMBULATORY_CARE_PROVIDER_SITE_OTHER): Payer: 59 | Admitting: Family Medicine

## 2014-06-22 VITALS — BP 145/91 | HR 79 | Temp 98.0°F | Ht 72.0 in | Wt 202.0 lb

## 2014-06-22 DIAGNOSIS — M25562 Pain in left knee: Secondary | ICD-10-CM

## 2014-06-22 NOTE — Progress Notes (Signed)
Subjective:  Patient ID: Joe Marks, male    DOB: 01/07/1951  Age: 64 y.o. MRN: [id number]  CC: Knee Pain   HPI Joe Marks presents for recheck of the left knee infection. He went to the emergency room the day after he was seen here last and was told that he had acute gout. The redness has started to receive he continues with his antibiotics. However, there is continuing pain and inability to extend the knee past -30. Weight bearing is very patent painful for him.  History Joe Marks has a past medical history of Diabetes mellitus without complication; Hypertension; and Kidney stones.   He has past surgical history that includes Shoulder surgery (Right) and Mandible surgery.   His family history includes COPD in his father; Cancer in his brother, brother, and father.He reports that he has never smoked. He has never used smokeless tobacco. He reports that he drinks alcohol. He reports that he does not use illicit drugs.  Current Outpatient Prescriptions on File Prior to Visit  Medication Sig Dispense Refill  . aspirin-acetaminophen-caffeine (EXCEDRIN MIGRAINE) 250-250-65 MG per tablet Take 1 tablet by mouth every 6 (six) hours as needed for pain.    Marland Kitchen. atorvastatin (LIPITOR) 80 MG tablet Take 1 tablet (80 mg total) by mouth daily. 90 tablet 3  . clindamycin (CLEOCIN) 300 MG capsule Take 300 mg by mouth 4 (four) times daily.    Marland Kitchen. HYDROcodone-acetaminophen (NORCO) 10-325 MG per tablet Take 1 tablet by mouth every 4 (four) hours as needed for moderate pain. 40 tablet 0  . lisinopril (PRINIVIL,ZESTRIL) 20 MG tablet Take 1 tablet (20 mg total) by mouth daily. 90 tablet 3  . meloxicam (MOBIC) 15 MG tablet Take 15 mg by mouth daily.    . metFORMIN (GLUCOPHAGE XR) 750 MG 24 hr tablet Take 1 tablet (750 mg total) by mouth 2 (two) times daily. 60 tablet 0  . pantoprazole (PROTONIX) 40 MG tablet Take 1 tablet (40 mg total) by mouth daily. 30 tablet 3  . SitaGLIPtin-MetFORMIN HCl (JANUMET XR)  [phone] MG TB24 Take 1 tablet by mouth daily. 30 tablet 2  . vardenafil (LEVITRA) 10 MG tablet Take 1 tablet (10 mg total) by mouth daily as needed for erectile dysfunction. 10 tablet 11  . traMADol (ULTRAM) 50 MG tablet Take 1 tablet (50 mg total) by mouth 2 (two) times daily as needed. 8 tablet 0   No current facility-administered medications on file prior to visit.    ROS Review of Systems  Constitutional: Negative.   Respiratory: Negative for cough.   Cardiovascular: Negative.  Negative for chest pain.  Musculoskeletal: Positive for myalgias and joint swelling.    Objective:  BP 145/91 mmHg  Pulse 79  Temp(Src) 98 F (36.7 C) (Oral)  Ht 6' (1.829 m)  Wt 202 lb (91.627 kg)  BMI 27.39 kg/m2  BP Readings from Last 3 Encounters:  06/22/14 145/91  06/18/14 143/89  06/15/14 130/71    Wt Readings from Last 3 Encounters:  06/22/14 202 lb (91.627 kg)  06/18/14 207 lb 6.4 oz (94.076 kg)  06/15/14 207 lb 3.2 oz (93.985 kg)     Physical Exam  Musculoskeletal: He exhibits edema and tenderness (at the lateral aspect of the tibial plateau on the left. The erythema has nearly completely resolved however there is ongoing edema and tenderness. There is no fluctuance.).    Lab Results  Component Value Date   HGBA1C 10.1 06/15/2014   HGBA1C 10.4% 12/09/2013   HGBA1C 9.5% 03/01/2013  Lab Results  Component Value Date   WBC 8.7 06/18/2014   HGB 14.3 06/18/2014   HCT 42.7 06/18/2014   PLT 345 06/18/2014   GLUCOSE 325* 06/15/2014   TRIG 167* 06/15/2014   HDL 40 06/15/2014   LDLCALC 97 06/15/2014   ALT 14 06/15/2014   AST 17 06/15/2014   NA 136 06/15/2014   K 4.4 06/15/2014   CL 96* 06/15/2014   CREATININE 1.03 06/15/2014   BUN 18 06/15/2014   CO2 25 06/15/2014   TSH 1.050 03/01/2013   HGBA1C 10.1 06/15/2014    US Venous Img Lower Unilateral Left  06/20/2014   CLINICAL DATA:  Left lower extremity cellulitis  EXAM: LEFT LOWER EXTREMITY VENOUS DUPLEX ULTRASOUND   TECHNIQUE: Gray-scale sonography with graded compression, as well as color Doppler and duplex ultrasound were performed to evaluate the left lower extremity deep venous system from the level of the common femoral vein and including the common femoral, femoral, profunda femoral, popliteal and calf veins including the posterior tibial, peroneal and gastrocnemius veins when visible. The superficial great saphenous vein was also interrogated. Spectral Doppler was utilized to evaluate flow at rest and with distal augmentation maneuvers in the common femoral, femoral and popliteal veins.  COMPARISON:  None.  FINDINGS: Contralateral Common Femoral Vein: Respiratory phasicity is normal and symmetric with the symptomatic side. No evidence of thrombus. Normal compressibility.  Common Femoral Vein: No evidence of thrombus. Normal compressibility, respiratory phasicity and response to augmentation.  Saphenofemoral Junction: No evidence of thrombus. Normal compressibility and flow on color Doppler imaging.  Profunda Femoral Vein: No evidence of thrombus. Normal compressibility and flow on color Doppler imaging.  Femoral Vein: No evidence of thrombus. Normal compressibility, respiratory phasicity and response to augmentation.  Popliteal Vein: No evidence of thrombus. Normal compressibility, respiratory phasicity and response to augmentation.  Calf Veins: No evidence of thrombus. Normal compressibility and flow on color Doppler imaging.  Superficial Great Saphenous Vein: No evidence of thrombus. Normal compressibility and flow on color Doppler imaging.  Venous Reflux:  None.  Other Findings: There is an enlarged with benign-appearing lymph node in the left inguinal region measuring 1.7 x 0.5 x 1.5 cm, probably reactive in etiology.  IMPRESSION: No evidence of left lower extremity deep venous thrombosis. Right common femoral vein patent. Mildly enlarged lymph node left inguinal region, probably inflammatory/reactive etiology.    Electronically Signed   By: Bretta Bang III M.D.   On: 06/20/2014 13:07    Assessment & Plan:   Joe Marks was seen today for knee pain.  Diagnoses and all orders for this visit:  Knee pain, acute, left Orders: -     Ambulatory referral to Orthopedic Surgery   I am having Mr. Beckner maintain his aspirin-acetaminophen-caffeine, lisinopril, vardenafil, pantoprazole, clindamycin, meloxicam, SitaGLIPtin-MetFORMIN HCl, atorvastatin, traMADol, metFORMIN, and HYDROcodone-acetaminophen.  No orders of the defined types were placed in this encounter.    Comments: Patient is to speak seen today by orthopedics. I'm concerned in spite of antibiotics on multiple occasions he continues to have pain and tenderness and swelling and due to the proximity of the joint and the failure to improve this may represent a septic joint. Therefore he was sent for orthopedic evaluation scheduled for 4 PM today.   Follow-up: No Follow-up on file.  Mechele Claude, M.D.

## 2014-06-23 NOTE — Telephone Encounter (Signed)
appt scheduled with ortho

## 2014-06-23 NOTE — Telephone Encounter (Signed)
See what the orthopedist said. I did not order it since he had the appointment with ortho. However, I still can if ortho does not have an acceptable plan according to the patient. Please let me know

## 2014-06-26 ENCOUNTER — Encounter: Payer: Self-pay | Admitting: Family Medicine

## 2014-06-29 ENCOUNTER — Telehealth: Payer: Self-pay | Admitting: *Deleted

## 2014-06-29 MED ORDER — LINAGLIPTIN-METFORMIN HCL 2.5-500 MG PO TABS: 2.0000 | ORAL_TABLET | Freq: Every day | ORAL | Status: DC

## 2014-06-29 NOTE — Telephone Encounter (Signed)
[facility] in [facility]

## 2014-06-29 NOTE — Telephone Encounter (Signed)
MM , I sent you this note but it is buried in the middle of some other stuff and I thought I'd better send it again, ins denied mikes [name] until he tries kazano, Joe Marks, or Joe Marks. Hopw one of these will work. Thanks!

## 2014-06-29 NOTE — Telephone Encounter (Signed)
Mm, Ins co denied Janumet xr because he has not taken and failed these formulary drugs kazano, jentadueto, and kombiglyze  Will one of these work for him? Thanks!

## 2014-06-29 NOTE — Addendum Note (Signed)
Addended by: Mechele ClaudeSTACKS, Joban Colledge on: 06/29/2014 07:25 PM   Modules accepted: Orders

## 2014-06-30 ENCOUNTER — Ambulatory Visit: Payer: 59 | Admitting: Family Medicine

## 2014-07-04 ENCOUNTER — Telehealth: Payer: Self-pay | Admitting: Family Medicine

## 2014-07-04 ENCOUNTER — Other Ambulatory Visit: Payer: Self-pay | Admitting: Family Medicine

## 2014-07-04 NOTE — Telephone Encounter (Signed)
I cannot prescribe pain medicines without seeing the patient.

## 2014-07-04 NOTE — Telephone Encounter (Signed)
Please review and advise.

## 2014-07-05 MED ORDER — CIPROFLOXACIN HCL 500 MG PO TABS
500.0000 mg | ORAL_TABLET | Freq: Two times a day (BID) | ORAL | Status: DC
Start: 2014-07-05 — End: 2014-10-05

## 2014-07-05 NOTE — Telephone Encounter (Signed)
Prescription for ciprofloxacin was sent in. Please let the patient know.

## 2014-07-05 NOTE — Telephone Encounter (Signed)
Pt is seeing ortho for knee pain Informed he needs to get meds from ortho Verbalizes understanding

## 2014-07-06 NOTE — Telephone Encounter (Signed)
Patient wife aware

## 2014-07-12 ENCOUNTER — Other Ambulatory Visit: Payer: Self-pay | Admitting: Family Medicine

## 2014-07-13 ENCOUNTER — Telehealth: Payer: Self-pay | Admitting: Family Medicine

## 2014-07-13 MED ORDER — HYDROCODONE-ACETAMINOPHEN 10-325 MG PO TABS
1.0000 | ORAL_TABLET | ORAL | Status: DC | PRN
Start: 2014-07-13 — End: 2014-10-05

## 2014-07-13 NOTE — Telephone Encounter (Signed)
Detailed message left that rx is up front to be picked up.

## 2014-07-13 NOTE — Telephone Encounter (Signed)
The orthopedists are the best trained to handle this problem. I do not recommend neurosurgery. Gabapentin is a good medication for long term, but has very little pain relief effect immediately. Keep taking it, but take the hydrocodone also.

## 2014-07-13 NOTE — Telephone Encounter (Signed)
[facility] and has a tibial fracture that is more internal and has a nerve that is either impinged or swollen and that is why he is having so much pain. Daughter wants to know if you feel like that he may need a EMG study or a neurosurgeon referral. [facility] him with gabapentin. He had to go to ER last night due to so much pain and they gave him dilaudid. Daughter is just concerned since this pain has been going on for 3 weeks.

## 2014-07-13 NOTE — Telephone Encounter (Signed)
I went ahead with a 1 time only refill of hydrocodone. The prescription is written and printed at nursing station [facility].

## 2014-07-13 NOTE — Telephone Encounter (Signed)
Please advise and route to Pool B 

## 2014-07-14 ENCOUNTER — Telehealth: Payer: Self-pay | Admitting: Family Medicine

## 2014-07-14 NOTE — Telephone Encounter (Signed)
We spoke with Joe Marks on 3/16

## 2014-07-14 NOTE — Telephone Encounter (Signed)
Spoke to [facility] and she states they are taking

## 2014-07-15 DIAGNOSIS — E1165 Type 2 diabetes mellitus with hyperglycemia: Secondary | ICD-10-CM | POA: Insufficient documentation

## 2014-07-15 DIAGNOSIS — I10 Essential (primary) hypertension: Secondary | ICD-10-CM | POA: Insufficient documentation

## 2014-07-22 DIAGNOSIS — B360 Pityriasis versicolor: Secondary | ICD-10-CM | POA: Insufficient documentation

## 2014-07-26 ENCOUNTER — Ambulatory Visit: Payer: 59 | Admitting: Family Medicine

## 2014-08-10 ENCOUNTER — Non-Acute Institutional Stay (SKILLED_NURSING_FACILITY): Payer: 59 | Admitting: Internal Medicine

## 2014-08-10 DIAGNOSIS — M9080 Osteopathy in diseases classified elsewhere, unspecified site: Secondary | ICD-10-CM | POA: Diagnosis not present

## 2014-08-10 DIAGNOSIS — E1169 Type 2 diabetes mellitus with other specified complication: Secondary | ICD-10-CM

## 2014-08-10 DIAGNOSIS — E1142 Type 2 diabetes mellitus with diabetic polyneuropathy: Secondary | ICD-10-CM

## 2014-08-10 DIAGNOSIS — M869 Osteomyelitis, unspecified: Secondary | ICD-10-CM

## 2014-08-10 DIAGNOSIS — A4101 Sepsis due to Methicillin susceptible Staphylococcus aureus: Secondary | ICD-10-CM

## 2014-08-17 NOTE — Progress Notes (Addendum)
Patient ID: Joe GillisMichael D Marks, male   DOB: 11/04/1950, 64 y.o.   MRN: [id number]                 HISTORY & PHYSICAL  DATE:  08/10/2014          [facility]                      LEVEL OF CARE:   SNF   CHIEF COMPLAINT:  Admission to SNF, [facility], admitted 07/14/2014, discharge date uncertain.   According to the patient, he went home for a day or two and then [place] in [place] nursing [place] before transferring here.    HISTORY OF PRESENT ILLNESS:  This is a 64 year-old man with poorly controlled diabetes with a hemoglobin A1c of 10.  He was admitted with a one-month history of incapacitating pain in his left knee.  This had been present for four weeks and, according to the patient, had started suddenly one morning when he got out of bed.  In retrospect, he had had no falls, no injury, no open wound.  He had not been unwell.  He tells me that two weeks before, he clipped a toenail on his right foot and caused a wound, but that is the only connection.    In any case, he developed increasing pain, difficulty in walking, and apparently an oval-shaped swelling over the lateral aspect of the knee just below the joint line.  He was seen subsequently by a multitude of doctors and he had a knee aspirate that was negative.  He had plain x-rays that were negative.  He was told that he had a stress fracture and given pain killers.  An [facility], who felt he might have gout.  He was later sent for an MRI, but that apparently only showed a small possible stress fracture of the proximal part of the tibia and he was suspected to have some nerve impingement of the common perineal nerve.   He was started on gabapentin.  At some point, he was given Dilaudid that left him in a stuporous state.     When [place] finally presented [place] health [place], he was noted to  have dehydration, hyperglycemia, mild hyponatremia, and a blood sugar of 448.  CBC showed a white count of 13.3 with a left shift.  Sed rate was 88.  Ultrasound was negative for a DVT.  CT scan of the left leg demonstrated what appears to be "volume loss in the fibular head".  An MRI was eventually done with findings consistent with acute osteomyelitis.  He was started on vanc and Rocephin, but ultimately developed a rash.  The Rocephin was stopped.    He went to the OR on 07/20/2014 for a purulent fluid collection in the deep soft tissues of the posterior lateral knee.  Culture grew MSSA.  Transthoracic echo was normal.    Repeat blood cultures were negative.   One out of two of the original blood cultures also showed MSSA.     The patient states he is feeling much better.    He lost 25 pounds over the course of the month.  He feels that his strength is improving.  He has no fever, chills, cough, shortness of breath, chest pain, dysuria, hematuria, or other joint problems.  He does have  diabetic neuropathy, and the only preceding event was the trauma to the right toe.     PAST MEDICAL HISTORY/PROBLEM LIST:              Type 2 diabetes with neuropathy.  He was on oral agents before this.    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    Bacteremia secondary to methicillin-sensitive Staph aureus.    Drug eruption.  Felt secondary to Rocephin.    Weight loss secondary to acute illness.    UTI.    Essential hypertension.    CURRENT MEDICATIONS:  Medication list is reviewed.    Advil 600 mg q.8 p.r.n.      Levemir 15 U.    Vancomycin 1, 250 mg q.12.    Started on 07/25/2014 for six weeks.    Oxycodone 10 mg q.6.    MiraLAX 17 g daily.    Neurontin 300 three times a day.    Jentadueto 2.5/500 b.i.d.      SOCIAL HISTORY:                   HOUSING:  The patient lives in [place] with his wife.    Moved here to be closer to a sick family member.   EMPLOYMENT  HISTORY:  Recently retired Patent examiner.   FUNCTIONAL STATUS:  Independent, active man.  Exercising, weightlifting, etc., prior to this.    REVIEW OF SYSTEMS:            HEENT:  No headache.    CHEST/RESPIRATORY:  No cough.  No sputum.        CARDIAC:  No chest pain.    GI:  No nausea, vomiting, abdominal pain.   GU:  No dysuria.     PHYSICAL EXAMINATION:   GENERAL APPEARANCE:  Healthy-looking man in no distress.   CHEST/RESPIRATORY:  Clear air entry bilaterally.     CARDIOVASCULAR:   CARDIAC:  Heart sounds are normal.  There are no murmurs.   GASTROINTESTINAL:   ABDOMEN:  No masses.     LIVER/SPLEEN/KIDNEY:   No liver, no spleen.  No tenderness.    VASCULAR:   ARTERIAL:  Extremities:  Peripheral pulses are palpable.    SKIN:   INSPECTION:  On the lateral aspect of his left leg just below the fibular head, he has a surgical wound which is clean.  I agree with the current treatment, which is Prisma.  This can be changed every second day.   LYMPHATICS:  None palpable in the cervical, clavicular, or axillary areas.     ASSESSMENT/PLAN:                 Methicillin-sensitive Staph aureus osteomyelitis, as described.  He has a surgical wound on the lateral aspect of his left knee that is clean.   He is on six weeks' worth of vancomycin. I am not certain why it was elected to keep him on Vanc  Methicillin-sensitive Staph aureus bacteremia.   A urine culture also grew methicillin-sensitive Staph aureus, again raising this as a possible source, although he does not relate to symptoms here.    Drug reaction to Rocephin.  We will make sure this is listed as an allergy.    Poorly controlled type 2 diabetes with diabetic neuropathy.  He is now on insulin.  His blood sugars are in the high 100s to low 200s.  I think this is going to mean a transition for him to insulin  Listed hypertension, although he  is not on any current medications here.  I will follow this in the facility.

## 2014-08-22 ENCOUNTER — Non-Acute Institutional Stay (SKILLED_NURSING_FACILITY): Payer: 59 | Admitting: Internal Medicine

## 2014-08-22 DIAGNOSIS — A4101 Sepsis due to Methicillin susceptible Staphylococcus aureus: Secondary | ICD-10-CM

## 2014-08-22 DIAGNOSIS — M9080 Osteopathy in diseases classified elsewhere, unspecified site: Secondary | ICD-10-CM | POA: Diagnosis not present

## 2014-08-22 DIAGNOSIS — M869 Osteomyelitis, unspecified: Secondary | ICD-10-CM | POA: Diagnosis not present

## 2014-08-22 DIAGNOSIS — E1169 Type 2 diabetes mellitus with other specified complication: Secondary | ICD-10-CM

## 2014-08-24 NOTE — Progress Notes (Addendum)
Patient ID: Joe Marks, male   DOB: [birth year], 63 y.o.   MRN: [id number]                PROGRESS NOTE  DATE:  08/22/2014                FACILITY: [facility]                        LEVEL OF CARE:   SNF   Acute Visit                      CHIEF COMPLAINT:  Hyperglycemia, neutropenia.     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  He is here receiving vancomycin.  Operative cultures of this area, according to my original note, grew methicillin-sensitive Staph aureus.  He had an allergic reaction to ceftriaxone.  Therefore, he was given vancomycin.   His echocardiogram was unremarkable.    I have reviewed his blood sugars today.  These are really not in good control at all.  He is out of the facility a lot but, on 07/21/2014, this was 163 and then a.c. dinner was 256.  This morning, he was 399.  He is currently on Levemir 22 U.  I think he was on oral agents prior to this.    LABORATORY DATA:  Lab work ordered by his Infectious Disease specialist with [name], [name], is back for my review today.    This shows a white count of 3.9, a hemoglobin of 11.3.  His granulocyte percentage is 12% with an absolute neutrophil count of 0.5 (500 per microliter).  His eosinophils are slightly high, as well.  Most of the cells are lymphocytes, not surprisingly.    His basic metabolic panel is normal.    His hemoglobin A1c is 9.7.    PHYSICAL EXAMINATION:   GENERAL APPEARANCE:  The patient does not look unwell.   SKIN:   INSPECTION:  The left knee incision is dry, but closing.    ASSESSMENT/PLAN:                Methicillin-sensitive Staph aureus infection in the left fibular head.  His surgical wound here is closing.  He has remained well.  He is on vancomycin, I think because of the Rocephin allergy he had in the hospital.     Neutropenia.  ?Vancomycin.  I have put in a call to his Infectious Disease specialist  to discuss this.   Type 2 diabetes.  On insulin.  He does not know how to give himself insulin.  However, as I look at things, it is likely he is going to require it.  I discussed this with the patient today.  He did not seem that receptive, thinking that when he goes home and gets back to his usual exercise routine, this will take care of itself.  I told him that I think we are going to have to look at sending him home on insulin and getting him some diabetic teaching.  I will try to arrange the latter.  In the meantime, I have increased his Levemir from 22 U to 30 U.  He is not in the facility enough to really do much more with this in terms of testing.     CPT CODE: 40981  Addendum: spoke with [name]. D/c vanc. Doxycycline 100mg  bid fo one month

## 2014-09-27 ENCOUNTER — Other Ambulatory Visit: Payer: Self-pay | Admitting: Internal Medicine

## 2014-10-03 ENCOUNTER — Telehealth: Payer: Self-pay | Admitting: Family Medicine

## 2014-10-04 NOTE — Telephone Encounter (Signed)
Pt needed appt for check up appt scheduled

## 2014-10-05 ENCOUNTER — Encounter: Payer: Self-pay | Admitting: Family Medicine

## 2014-10-05 ENCOUNTER — Ambulatory Visit (INDEPENDENT_AMBULATORY_CARE_PROVIDER_SITE_OTHER): Payer: 59 | Admitting: Family Medicine

## 2014-10-05 VITALS — Temp 97.2°F | Ht 72.0 in | Wt 199.0 lb

## 2014-10-05 DIAGNOSIS — I10 Essential (primary) hypertension: Secondary | ICD-10-CM

## 2014-10-05 DIAGNOSIS — E1165 Type 2 diabetes mellitus with hyperglycemia: Secondary | ICD-10-CM | POA: Diagnosis not present

## 2014-10-05 DIAGNOSIS — IMO0002 Reserved for concepts with insufficient information to code with codable children: Secondary | ICD-10-CM

## 2014-10-05 DIAGNOSIS — N521 Erectile dysfunction due to diseases classified elsewhere: Secondary | ICD-10-CM

## 2014-10-05 MED ORDER — TADALAFIL 20 MG PO TABS: 10.0000 mg | ORAL_TABLET | ORAL | Status: AC | PRN

## 2014-10-05 MED ORDER — LISINOPRIL 20 MG PO TABS: 20.0000 mg | ORAL_TABLET | Freq: Every day | ORAL | Status: DC

## 2014-10-05 MED ORDER — TRAMADOL HCL 50 MG PO TABS: 50.0000 mg | ORAL_TABLET | Freq: Two times a day (BID) | ORAL | Status: DC | PRN

## 2014-10-05 MED ORDER — METFORMIN HCL ER 750 MG PO TB24: 750.0000 mg | ORAL_TABLET | Freq: Two times a day (BID) | ORAL | Status: DC

## 2014-10-05 NOTE — Progress Notes (Signed)
Subjective:  Patient ID: Joe Marks, male    DOB: [birth year]  Age: 65 y.o. MRN: [id number]  CC: Knee Pain   HPI ALPHONSO GREGSON presents forFollow-up of diabetes. Patient does not check blood sugar at home Patient denies symptoms such as polyuria, polydipsia, excessive hunger, nausea No significant hypoglycemic spells noted. Medications as noted below. Taking them regularly without complication/adverse reaction being reported today. Patient requests referral to endocrinology for evaluation and treatment of the condition. He refuses to have any blood work done here today. He does have erectile dysfunction very likely related to his diabetes and would like to try Cialis  He states that his knee was eventually diagnosed with osteomyelitis. He went through multiple specialists before a family relative who was an orthopedist was able to suggest the diagnosis. Subsequently testing confirmed the diagnosis and he had surgical debridement. He still has some pain but it is tremendously improved from presurgery treatment.    History Jmichael has a past medical history of Diabetes mellitus without complication; Hypertension; and Kidney stones.   He has past surgical history that includes Shoulder surgery (Right) and Mandible surgery.   His family history includes COPD in his father; Cancer in his brother, brother, and father.He reports that he has never smoked. He has never used smokeless tobacco. He reports that he drinks alcohol. He reports that he does not use illicit drugs.  Current Outpatient Prescriptions on File Prior to Visit  Medication Sig Dispense Refill  . aspirin-acetaminophen-caffeine (EXCEDRIN MIGRAINE) 250-250-65 MG per tablet Take 1 tablet by mouth every 6 (six) hours as needed for pain.     No current facility-administered medications on file prior to visit.    ROS Review of Systems  Objective:  Temp(Src) 97.2 F (36.2 C) (Oral)  Ht 6' (1.829 m)  Wt 199 lb (90.266  kg)  BMI 26.98 kg/m2  BP Readings from Last 3 Encounters:  06/22/14 145/91  06/18/14 143/89  06/15/14 130/71    Wt Readings from Last 3 Encounters:  10/05/14 199 lb (90.266 kg)  06/22/14 202 lb (91.627 kg)  06/18/14 207 lb 6.4 oz (94.076 kg)     Physical Exam  Lab Results  Component Value Date   HGBA1C 10.1 06/15/2014   HGBA1C 10.4% 12/09/2013   HGBA1C 9.5% 03/01/2013    Lab Results  Component Value Date   WBC 8.7 06/18/2014   HGB 14.3 06/18/2014   HCT 42.7 06/18/2014   PLT 345 06/18/2014   GLUCOSE 325* 06/15/2014   CHOL 170 06/15/2014   TRIG 167* 06/15/2014   HDL 40 06/15/2014   LDLCALC 97 06/15/2014   ALT 14 06/15/2014   AST 17 06/15/2014   NA 136 06/15/2014   K 4.4 06/15/2014   CL 96* 06/15/2014   CREATININE 1.03 06/15/2014   BUN 18 06/15/2014   CO2 25 06/15/2014   TSH 1.050 03/01/2013   HGBA1C 10.1 06/15/2014     Assessment & Plan:   Erasto was seen today for knee pain.  Diagnoses and all orders for this visit:  Essential hypertension, benign Orders: -     Ambulatory referral to Endocrinology -     lisinopril (PRINIVIL,ZESTRIL) 20 MG tablet; Take 1 tablet (20 mg total) by mouth daily.  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 -     traMADol (ULTRAM) 50 MG tablet; Take 1 tablet (50 mg total) by mouth 2 (two) times daily as needed. -     traMADol (ULTRAM) 50 MG tablet; Take 1 tablet (50 mg total) by mouth 2 (two) times daily as needed. -     tadalafil (CIALIS) 20 MG tablet; Take 0.5-1 tablets (10-20 mg total) by mouth every other day as needed for erectile dysfunction.   I have discontinued Mr. Oneita KrasHundley's vardenafil, pantoprazole, meloxicam, SitaGLIPtin-MetFORMIN HCl, atorvastatin, ciprofloxacin,  HYDROcodone-acetaminophen, gabapentin, Linagliptin-Metformin HCl, and gabapentin. I am also having him start on tadalafil. Additionally, I am having him maintain his aspirin-acetaminophen-caffeine, JENTADUETO, metFORMIN, lisinopril, traMADol, and traMADol.  Meds ordered this encounter  Medications  . DISCONTD: gabapentin (NEURONTIN) 100 MG capsule    Sig: Take 300 mg by mouth.  . DISCONTD: Linagliptin-Metformin HCl 2.5-500 MG TABS    Sig: Take 1 tablet by mouth.  . DISCONTD: gabapentin (NEURONTIN) 100 MG capsule    Sig:   . JENTADUETO 2.5-500 MG TABS    Sig:   . metFORMIN (GLUCOPHAGE XR) 750 MG 24 hr tablet    Sig: Take 1 tablet (750 mg total) by mouth 2 (two) times daily.    Dispense:  60 tablet    Refill:  0  . lisinopril (PRINIVIL,ZESTRIL) 20 MG tablet    Sig: Take 1 tablet (20 mg total) by mouth daily.    Dispense:  90 tablet    Refill:  3  . traMADol (ULTRAM) 50 MG tablet    Sig: Take 1 tablet (50 mg total) by mouth 2 (two) times daily as needed.    Dispense:  8 tablet    Refill:  0  . traMADol (ULTRAM) 50 MG tablet    Sig: Take 1 tablet (50 mg total) by mouth 2 (two) times daily as needed.    Dispense:  8 tablet    Refill:  0  . tadalafil (CIALIS) 20 MG tablet    Sig: Take 0.5-1 tablets (10-20 mg total) by mouth every other day as needed for erectile dysfunction.    Dispense:  5 tablet    Refill:  11     Follow-up: Return in about 6 months (around 04/06/2015), or if symptoms worsen or fail to improve.  Mechele ClaudeWarren Curley Fayette, M.D.

## 2014-11-02 ENCOUNTER — Other Ambulatory Visit: Payer: Self-pay | Admitting: Family Medicine

## 2014-11-02 NOTE — Telephone Encounter (Signed)
Last seen 10/05/14 Dr Darlyn ReadStacks  If approved print

## 2014-11-08 ENCOUNTER — Ambulatory Visit: Payer: PRIVATE HEALTH INSURANCE | Admitting: Endocrinology

## 2014-12-27 ENCOUNTER — Ambulatory Visit: Payer: PRIVATE HEALTH INSURANCE | Admitting: Endocrinology

## 2014-12-29 ENCOUNTER — Other Ambulatory Visit: Payer: Self-pay | Admitting: Internal Medicine

## 2015-01-03 ENCOUNTER — Other Ambulatory Visit: Payer: Self-pay | Admitting: *Deleted

## 2015-01-03 MED ORDER — JENTADUETO 2.5-500 MG PO TABS: 1.0000 | ORAL_TABLET | Freq: Two times a day (BID) | ORAL | Status: DC

## 2015-01-17 ENCOUNTER — Ambulatory Visit: Payer: PRIVATE HEALTH INSURANCE | Admitting: Endocrinology

## 2015-04-06 LAB — HM DIABETES EYE EXAM

## 2015-04-10 ENCOUNTER — Ambulatory Visit: Payer: 59 | Admitting: Family Medicine

## 2015-04-11 ENCOUNTER — Encounter: Payer: Self-pay | Admitting: Family Medicine

## 2015-04-17 ENCOUNTER — Encounter: Payer: Self-pay | Admitting: *Deleted

## 2015-05-17 ENCOUNTER — Telehealth: Payer: Self-pay | Admitting: Family Medicine

## 2015-06-13 ENCOUNTER — Telehealth: Payer: Self-pay

## 2015-06-13 NOTE — Telephone Encounter (Signed)
x

## 2015-06-14 ENCOUNTER — Telehealth: Payer: Self-pay

## 2015-06-14 MED ORDER — SAXAGLIPTIN-METFORMIN ER 5-1000 MG PO TB24: 1.0000 | ORAL_TABLET | Freq: Every day | ORAL | Status: DC

## 2015-06-14 NOTE — Telephone Encounter (Signed)
Insurance denied prior authorization for [employer]   Must tru Kombiglyze XR

## 2015-06-14 NOTE — Telephone Encounter (Signed)
Pt notified of new RX Verbalizes understanding 

## 2015-06-14 NOTE — Telephone Encounter (Signed)
Insurance denied prior authorization for [employer]   Must try Kombiglyze XR

## 2015-06-14 NOTE — Telephone Encounter (Signed)
I sent in the requested prescription 

## 2015-06-14 NOTE — Telephone Encounter (Signed)
There was no message attached to this note???

## 2015-06-30 ENCOUNTER — Ambulatory Visit: Payer: Self-pay | Admitting: Pediatrics

## 2015-07-26 ENCOUNTER — Ambulatory Visit (INDEPENDENT_AMBULATORY_CARE_PROVIDER_SITE_OTHER): Payer: BLUE CROSS/BLUE SHIELD | Admitting: Family Medicine

## 2015-07-26 ENCOUNTER — Encounter: Payer: Self-pay | Admitting: Family Medicine

## 2015-07-26 VITALS — BP 111/63 | HR 106 | Temp 98.0°F | Ht 72.0 in | Wt 203.6 lb

## 2015-07-26 DIAGNOSIS — E11621 Type 2 diabetes mellitus with foot ulcer: Secondary | ICD-10-CM | POA: Diagnosis not present

## 2015-07-26 DIAGNOSIS — R05 Cough: Secondary | ICD-10-CM

## 2015-07-26 DIAGNOSIS — L97519 Non-pressure chronic ulcer of other part of right foot with unspecified severity: Secondary | ICD-10-CM | POA: Diagnosis not present

## 2015-07-26 DIAGNOSIS — R509 Fever, unspecified: Secondary | ICD-10-CM | POA: Diagnosis not present

## 2015-07-26 DIAGNOSIS — R059 Cough, unspecified: Secondary | ICD-10-CM

## 2015-07-26 DIAGNOSIS — L97509 Non-pressure chronic ulcer of other part of unspecified foot with unspecified severity: Secondary | ICD-10-CM

## 2015-07-26 LAB — VERITOR FLU A/B WAIVED
INFLUENZA A: NEGATIVE
INFLUENZA B: NEGATIVE

## 2015-07-26 MED ORDER — CIPROFLOXACIN HCL 500 MG PO TABS: 500.0000 mg | ORAL_TABLET | Freq: Two times a day (BID) | ORAL | Status: DC

## 2015-07-26 MED ORDER — SILVER SULFADIAZINE 1 % EX CREA: 1.0000 "application " | TOPICAL_CREAM | Freq: Every day | CUTANEOUS | Status: DC

## 2015-07-26 NOTE — Progress Notes (Signed)
Subjective:  Patient ID: Joe Marks, male    DOB: 06/25/1950  Age: 65 y.o. MRN: [id number]  CC: URI and Skin Ulcer   HPI Joe GillisMichael D Beighley presents for 3 weeks of sore on foot after getting new, stiff dress shoes. Moderately painful. Getting more red. Located at tip of right 1st toe. Cleaning daily with hydrogen peroxide. Also URI sx. Cough given scrip for augmentin.   History Casimiro NeedleMichael has a past medical history of Diabetes mellitus without complication (HCC); Hypertension; and Kidney stones.   He has past surgical history that includes Shoulder surgery (Right) and Mandible surgery.   His family history includes COPD in his father; Cancer in his brother, brother, and father.He reports that he has never smoked. He has never used smokeless tobacco. He reports that he drinks alcohol. He reports that he does not use illicit drugs.    ROS Review of Systems  Constitutional: Negative for fever, chills, activity change and appetite change.  HENT: Positive for congestion. Negative for ear discharge, ear pain, hearing loss, nosebleeds, postnasal drip, rhinorrhea, sinus pressure, sneezing and trouble swallowing.   Respiratory: Positive for cough. Negative for chest tightness and shortness of breath.   Cardiovascular: Negative for chest pain and palpitations.  Skin: Positive for wound. Negative for rash.    Objective:  BP 111/63 mmHg  Pulse 106  Temp(Src) 98 F (36.7 C) (Oral)  Ht 6' (1.829 m)  Wt 203 lb 9.6 oz (92.352 kg)  BMI 27.61 kg/m2  SpO2 98%  BP Readings from Last 3 Encounters:  07/26/15 111/63  06/22/14 145/91  06/18/14 143/89    Wt Readings from Last 3 Encounters:  07/26/15 203 lb 9.6 oz (92.352 kg)  10/05/14 199 lb (90.266 kg)  06/22/14 202 lb (91.627 kg)     Physical Exam  Constitutional: He appears well-developed and well-nourished.  HENT:  Head: Normocephalic and atraumatic.  Right Ear: Tympanic membrane and external ear normal. No decreased hearing is  noted.  Left Ear: Tympanic membrane and external ear normal. No decreased hearing is noted.  Mouth/Throat: No oropharyngeal exudate or posterior oropharyngeal erythema.  Eyes: Pupils are equal, round, and reactive to light.  Neck: Normal range of motion. Neck supple.  Cardiovascular: Normal rate and regular rhythm.   No murmur heard. Pulmonary/Chest: Breath sounds normal. No respiratory distress.  Abdominal: Soft. Bowel sounds are normal. He exhibits no mass. There is no tenderness.  Skin:  1 cm ulcer at the tip of the right first toe distal to the nail. This is surrounded by dry brittle necrotic tissue. There is some granulation at the center. Around the base of the nail is moderate erythema without fluctuance.  Vitals reviewed.    Lab Results  Component Value Date   WBC 8.7 06/18/2014   HGB 14.3 06/18/2014   HCT 42.7 06/18/2014   PLT 345 06/18/2014   GLUCOSE 325* 06/15/2014   CHOL 170 06/15/2014   TRIG 167* 06/15/2014   HDL 40 06/15/2014   LDLCALC 97 06/15/2014   ALT 14 06/15/2014   AST 17 06/15/2014   NA 136 06/15/2014   K 4.4 06/15/2014   CL 96* 06/15/2014   CREATININE 1.03 06/15/2014   BUN 18 06/15/2014   CO2 25 06/15/2014   TSH 1.050 03/01/2013   HGBA1C 10.1 06/15/2014    [place] Venous Img Lower Unilateral Left  06/20/2014  CLINICAL DATA:  Left lower extremity cellulitis EXAM: LEFT LOWER EXTREMITY VENOUS DUPLEX ULTRASOUND TECHNIQUE: Gray-scale sonography with graded compression, as well as  color Doppler and duplex ultrasound were performed to evaluate the left lower extremity deep venous system from the level of the common femoral vein and including the common femoral, femoral, profunda femoral, popliteal and calf veins including the posterior tibial, peroneal and gastrocnemius veins when visible. The superficial great saphenous vein was also interrogated. Spectral Doppler was utilized to evaluate flow at rest and with distal augmentation maneuvers in the common femoral,  femoral and popliteal veins. COMPARISON:  None. FINDINGS: Contralateral Common Femoral Vein: Respiratory phasicity is normal and symmetric with the symptomatic side. No evidence of thrombus. Normal compressibility. Common Femoral Vein: No evidence of thrombus. Normal compressibility, respiratory phasicity and response to augmentation. Saphenofemoral Junction: No evidence of thrombus. Normal compressibility and flow on color Doppler imaging. Profunda Femoral Vein: No evidence of thrombus. Normal compressibility and flow on color Doppler imaging. Femoral Vein: No evidence of thrombus. Normal compressibility, respiratory phasicity and response to augmentation. Popliteal Vein: No evidence of thrombus. Normal compressibility, respiratory phasicity and response to augmentation. Calf Veins: No evidence of thrombus. Normal compressibility and flow on color Doppler imaging. Superficial Great Saphenous Vein: No evidence of thrombus. Normal compressibility and flow on color Doppler imaging. Venous Reflux:  None. Other Findings: There is an enlarged with benign-appearing lymph node in the left inguinal region measuring 1.7 x 0.5 x 1.5 cm, probably reactive in etiology. IMPRESSION: No evidence of left lower extremity deep venous thrombosis. Right common femoral vein patent. Mildly enlarged lymph node left inguinal region, probably inflammatory/reactive etiology. Electronically Signed   By: Bretta Bang III M.D.   On: 06/20/2014 13:07    Assessment & Plan:   Bridger was seen today for uri and skin ulcer.  Diagnoses and all orders for this visit:  Diabetic ulcer of right foot associated with type 2 diabetes mellitus (HCC)  Cough -     Veritor Flu A/B Waived  Fever, unspecified -     Veritor Flu A/B Waived  Other orders -     silver sulfADIAZINE (SILVADENE) 1 % cream; Apply 1 application topically daily. -     ciprofloxacin (CIPRO) 500 MG tablet; Take 1 tablet (500 mg total) by mouth 2 (two) times  daily.   Do not use hydrogen peroxide. Instead, cleanse the wound daily by soaking off the previous dressing. Followed by warm soapy water and rinse with clear, warm water. Pat dry. Then apply a thick layer of Silvadene cream. Cover that with several layers of gauze. Repeat daily  Monitor for for signs of infection which include increasing pain, swelling, redness and milky or mucousy drainage   I have discontinued Mr. Knoblock traMADol, traMADol, traMADol, and JENTADUETO. I am also having him start on silver sulfADIAZINE and ciprofloxacin. Additionally, I am having him maintain his aspirin-acetaminophen-caffeine, metFORMIN, lisinopril, tadalafil, and Saxagliptin-Metformin. Also discontinue the Augmentin.  Meds ordered this encounter  Medications  . silver sulfADIAZINE (SILVADENE) 1 % cream    Sig: Apply 1 application topically daily.    Dispense:  400 g    Refill:  0  . ciprofloxacin (CIPRO) 500 MG tablet    Sig: Take 1 tablet (500 mg total) by mouth 2 (two) times daily.    Dispense:  20 tablet    Refill:  0     Follow-up: No Follow-up on file.  Mechele Claude, M.D.

## 2015-07-26 NOTE — Patient Instructions (Signed)
Do not use hydrogen peroxide. Instead, cleanse the wound daily by soaking off the previous dressing. Followed by warm soapy water and rinse with clear, warm water. Pat dry. Then apply a thick layer of Silvadene cream. Cover that with several layers of gauze. Repeat daily  Monitor for for signs of infection which include increasing pain, swelling, redness and milky or mucousy drainage

## 2015-07-27 ENCOUNTER — Telehealth: Payer: Self-pay | Admitting: Family Medicine

## 2015-07-27 MED ORDER — BENZONATATE 200 MG PO CAPS: 200.0000 mg | ORAL_CAPSULE | Freq: Two times a day (BID) | ORAL | Status: DC | PRN

## 2015-07-27 NOTE — Telephone Encounter (Signed)
Patient is requesting something for cough. He states he was up all night with the cough. Please advise

## 2015-07-27 NOTE — Telephone Encounter (Signed)
Patient aware.

## 2015-07-27 NOTE — Telephone Encounter (Signed)
Covering for PCP  I will gladly send tessalon  If he is getting ill with severe cough he may need to be seen consider DM2.   Murtis SinkSam Vayda Dungee, MD [facility] 07/27/2015, 9:44 AM

## 2015-08-08 ENCOUNTER — Ambulatory Visit: Payer: BLUE CROSS/BLUE SHIELD | Admitting: Family Medicine

## 2015-08-09 ENCOUNTER — Encounter: Payer: Self-pay | Admitting: Family Medicine

## 2015-10-11 ENCOUNTER — Other Ambulatory Visit: Payer: Self-pay | Admitting: Family Medicine
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
## 2016-12-23 ENCOUNTER — Telehealth: Payer: Self-pay | Admitting: Family Medicine

## 2016-12-23 NOTE — Telephone Encounter (Signed)
Pt aware - -needs to come in for follow up

## 2017-01-01 ENCOUNTER — Ambulatory Visit: Payer: BLUE CROSS/BLUE SHIELD | Admitting: Family Medicine

## 2017-01-06 ENCOUNTER — Ambulatory Visit (INDEPENDENT_AMBULATORY_CARE_PROVIDER_SITE_OTHER): Payer: Medicare Other | Admitting: Family Medicine

## 2017-01-06 ENCOUNTER — Encounter: Payer: Self-pay | Admitting: Family Medicine

## 2017-01-06 VITALS — BP 136/80 | HR 78 | Temp 96.6°F | Ht 73.0 in | Wt 206.8 lb

## 2017-01-06 DIAGNOSIS — I10 Essential (primary) hypertension: Secondary | ICD-10-CM | POA: Diagnosis not present

## 2017-01-06 DIAGNOSIS — E1165 Type 2 diabetes mellitus with hyperglycemia: Secondary | ICD-10-CM | POA: Diagnosis not present

## 2017-01-06 DIAGNOSIS — Z8739 Personal history of other diseases of the musculoskeletal system and connective tissue: Secondary | ICD-10-CM | POA: Diagnosis not present

## 2017-01-06 NOTE — Patient Instructions (Signed)
Great to meet you!  Come back in 4 months unless you need us sooner.

## 2017-01-06 NOTE — Progress Notes (Addendum)
   HPI  Patient presents today here to follow-up for hypertension.  Patient was recently hospitalized while visiting his daughter internal since out [place] with angioedema likely due to ACE inhibitor.  Patient states that he awoke in the middle the night with shortness of breath, even with himself linear and could observe his tongue swelling. He was hospitalized for 4 days.  He has a complex past medical history with type 2 diabetes now status post right great toe amputation as well as left tib/fib osteomyelitis.  Patient feels well. He sees Dr. Hartford Poli for endocrinology and his last A1c was 7.0. He is not fasting today.  A she has not taken lisinopril in 3-1/2 weeks. He has not checked his blood pressure home. No chest pain or headaches.  PMH: Smoking status noted ROS: Per HPI  Objective: BP 136/80   Pulse 78   Temp (!) 96.6 F (35.9 C) (Oral)   Ht _0  (1.854 m)   Wt 206 lb 12.8 oz (93.8 kg)   BMI 27.28 kg/m  Gen: NAD, alert, cooperative with exam HEENT: NCAT CV: RRR, good S1/S2, no murmur Resp: CTABL, no wheezes, non-labored Ext: No edema, warm Neuro: Alert and oriented, No gross deficits  Diabetic Foot Exam - Simple   Simple Foot Form Diabetic Foot exam was performed with the following findings:  Yes 01/06/2017  3:30 PM  Visual Inspection See comments:  Yes Sensation Testing Intact to touch and monofilament testing bilaterally:  Yes Pulse Check Posterior Tibialis and Dorsalis pulse intact bilaterally:  Yes Comments Right great toe amputation, partial right second toe amputation     Assessment and plan:  #Type 2 diabetes Well controlled, managed by endocrinology Only Janumet Patient has history of diabetic foot ulcer status post right first ray amputation, also history of acute osteomyelitis of the left tib-fib   # Hypertension Well-controlled today without medication Diet controlled No medications for now. For now we'll avoid ACE inhibitor's and ARB  given severe allergy, I discussed that with compelling reason there could be a trial of ARB      Orders Placed This Encounter  Procedures  . CMP14+EGFR  . CBC with Differential/Platelet  . LDL Cholesterol, Direct  . TSH    Meds ordered this encounter  Medications  . JANUMET XR 50-1000 MG TB24    Laroy Apple, MD [facility] 01/06/2017, 3:33 PM

## 2017-01-07 LAB — CMP14+EGFR
A/G RATIO: 1.9 (ref 1.2–2.2)
ALT: 17 IU/L (ref 0–44)
AST: 19 IU/L (ref 0–40)
Albumin: 4.2 g/dL (ref 3.6–4.8)
Alkaline Phosphatase: 83 IU/L (ref 39–117)
BUN/Creatinine Ratio: 12 (ref 10–24)
BUN: 16 mg/dL (ref 8–27)
Bilirubin Total: 0.5 mg/dL (ref 0.0–1.2)
CALCIUM: 9.4 mg/dL (ref 8.6–10.2)
CO2: 23 mmol/L (ref 20–29)
CREATININE: 1.35 mg/dL — AB (ref 0.76–1.27)
Chloride: 101 mmol/L (ref 96–106)
GFR, EST AFRICAN AMERICAN: 63 mL/min/{1.73_m2} (ref >59)
GFR, EST NON AFRICAN AMERICAN: 55 mL/min/{1.73_m2} — AB (ref >59)
Globulin, Total: 2.2 g/dL (ref 1.5–4.5)
Glucose: 326 mg/dL — ABNORMAL HIGH (ref 65–99)
Potassium: 4.8 mmol/L (ref 3.5–5.2)
Sodium: 140 mmol/L (ref 134–144)
TOTAL PROTEIN: 6.4 g/dL (ref 6.0–8.5)

## 2017-01-07 LAB — CBC WITH DIFFERENTIAL/PLATELET
BASOS: 1 %
Basophils Absolute: 0 10*3/uL (ref 0.0–0.2)
EOS (ABSOLUTE): 0.4 10*3/uL (ref 0.0–0.4)
EOS: 5 %
HEMOGLOBIN: 12.1 g/dL — AB (ref 13.0–17.7)
Hematocrit: 36.3 % — ABNORMAL LOW (ref 37.5–51.0)
IMMATURE GRANS (ABS): 0 10*3/uL (ref 0.0–0.1)
IMMATURE GRANULOCYTES: 0 %
LYMPHS: 37 %
Lymphocytes Absolute: 2.6 10*3/uL (ref 0.7–3.1)
MCH: 29.1 pg (ref 26.6–33.0)
MCHC: 33.3 g/dL (ref 31.5–35.7)
MCV: 87 fL (ref 79–97)
MONOCYTES: 9 %
Monocytes Absolute: 0.6 10*3/uL (ref 0.1–0.9)
NEUTROS ABS: 3.4 10*3/uL (ref 1.4–7.0)
Neutrophils: 48 %
PLATELETS: 301 10*3/uL (ref 150–379)
RBC: 4.16 x10E6/uL (ref 4.14–5.80)
RDW: 13.6 % (ref 12.3–15.4)
WBC: 7 10*3/uL (ref 3.4–10.8)

## 2017-01-07 LAB — TSH: TSH: 2.13 u[IU]/mL (ref 0.450–4.500)

## 2017-01-07 LAB — LDL CHOLESTEROL, DIRECT: LDL DIRECT: 85 mg/dL (ref 0–99)

## 2017-02-06 ENCOUNTER — Ambulatory Visit: Payer: Self-pay | Admitting: Family Medicine

## 2018-07-29 DEATH — deceased

## 2018-08-28 DEATH — deceased
